# Patient Record
Sex: Male | Born: 1960 | Race: Asian | Hispanic: No | Marital: Married | Smoking: Former smoker
Health system: Western US, Academic
[De-identification: ages and names within clinical notes are randomized; demographics above are authoritative.]

## PROBLEM LIST (undated history)

## (undated) ENCOUNTER — Ambulatory Visit
Admission: EM | Discharge status: Against Medical Advice | Payer: Self-pay | Attending: Internal Medicine | Admitting: Internal Medicine

## (undated) DIAGNOSIS — I10 Essential (primary) hypertension: Secondary | ICD-10-CM

## (undated) DIAGNOSIS — L409 Psoriasis, unspecified: Secondary | ICD-10-CM

## (undated) DIAGNOSIS — K5792 Diverticulitis of intestine, part unspecified, without perforation or abscess without bleeding: Secondary | ICD-10-CM

## (undated) DIAGNOSIS — L309 Dermatitis, unspecified: Secondary | ICD-10-CM

## (undated) DIAGNOSIS — M549 Dorsalgia, unspecified: Secondary | ICD-10-CM

## (undated) HISTORY — DX: Dorsalgia, unspecified: M54.9

## (undated) HISTORY — PX: NO SURGICAL HISTORY: 101002

---

## 2012-08-19 ENCOUNTER — Ambulatory Visit (INDEPENDENT_AMBULATORY_CARE_PROVIDER_SITE_OTHER): Payer: PRIVATE HEALTH INSURANCE | Admitting: Internal Medicine

## 2012-08-19 VITALS — BP 123/86 | HR 80 | Temp 98.7°F | Resp 16 | Ht 63.0 in | Wt 159.6 lb

## 2012-08-19 DIAGNOSIS — IMO0002 Reserved for concepts with insufficient information to code with codable children: Secondary | ICD-10-CM

## 2012-08-19 DIAGNOSIS — F809 Developmental disorder of speech and language, unspecified: Secondary | ICD-10-CM

## 2012-08-19 DIAGNOSIS — J309 Allergic rhinitis, unspecified: Secondary | ICD-10-CM

## 2012-08-19 DIAGNOSIS — J3489 Other specified disorders of nose and nasal sinuses: Secondary | ICD-10-CM

## 2012-08-19 DIAGNOSIS — J302 Other seasonal allergic rhinitis: Secondary | ICD-10-CM

## 2012-08-19 DIAGNOSIS — J301 Allergic rhinitis due to pollen: Secondary | ICD-10-CM | POA: Insufficient documentation

## 2012-08-19 MED ORDER — FLUTICASONE PROPIONATE 50 MCG/ACT NA SUSP: 2.0000 | Freq: Every day | NASAL | Status: DC

## 2012-08-19 MED ORDER — METHYLPREDNISOLONE ACETATE 80 MG/ML IJ SUSP
120.0000 mg | Freq: Once | INTRAMUSCULAR | Status: AC
  Administered 2012-08-19: 120 mg via INTRAMUSCULAR

## 2012-08-19 MED ORDER — CETIRIZINE HCL 10 MG PO TABS: 10.0000 mg | ORAL_TABLET | Freq: Every day | ORAL | Status: DC

## 2012-08-19 NOTE — Progress Notes (Signed)
  Subjective:    Patient ID: Cory Murphy, male    DOB: 1960/11/28, 52 y.o.   MRN: [id number]  HPI Sneezing and nasal congestion for 1 monyh, has occ ha and fatigue. No cp, sob, chest congestion. Speaks broken English Has 2 daughters  Review of Systems neg    Objective:   Physical Exam  Vitals reviewed. Constitutional: He is oriented to person, place, and time. He appears well-developed and well-nourished. No distress.  HENT:  Right Ear: External ear normal.  Left Ear: External ear normal.  Nose: Mucosal edema, rhinorrhea and sinus tenderness present. Right sinus exhibits no maxillary sinus tenderness and no frontal sinus tenderness. Left sinus exhibits no maxillary sinus tenderness and no frontal sinus tenderness.  Mouth/Throat: Oropharynx is clear and moist.  Cardiovascular: Normal rate, regular rhythm and normal heart sounds.   Pulmonary/Chest: Effort normal and breath sounds normal. He has no wheezes.  Neurological: He is alert and oriented to person, place, and time. No cranial nerve deficit. He exhibits normal muscle tone. Coordination normal.  Psychiatric: He has a normal mood and affect. His behavior is normal.          Assessment & Plan:  Depomedrol/Cetirizine/fluticasone nasal.

## 2012-08-19 NOTE — Patient Instructions (Addendum)
Allergies, Generic  Allergies may happen from anything your body is sensitive to. This may be food, medicines, pollens, chemicals, and nearly anything around you in everyday life that produces allergens. An allergen is anything that causes an allergy producing substance. Heredity is often a factor in causing these problems. This means you may have some of the same allergies as your parents.  Food allergies happen in all age groups. Food allergies are some of the most severe and life threatening. Some common food allergies are cow's milk, seafood, eggs, nuts, wheat, and soybeans.  SYMPTOMS    Swelling around the mouth.   An itchy red rash or hives.   Vomiting or diarrhea.   Difficulty breathing.  SEVERE ALLERGIC REACTIONS ARE LIFE-THREATENING.  This reaction is called anaphylaxis. It can cause the mouth and throat to swell and cause difficulty with breathing and swallowing. In severe reactions only a trace amount of food (for example, peanut oil in a salad) may cause death within seconds.  Seasonal allergies occur in all age groups. These are seasonal because they usually occur during the same season every year. They may be a reaction to molds, grass pollens, or tree pollens. Other causes of problems are house dust mite allergens, pet dander, and mold spores. The symptoms often consist of nasal congestion, a runny itchy nose associated with sneezing, and tearing itchy eyes. There is often an associated itching of the mouth and ears. The problems happen when you come in contact with pollens and other allergens. Allergens are the particles in the air that the body reacts to with an allergic reaction. This causes you to release allergic antibodies. Through a chain of events, these eventually cause you to release histamine into the blood stream. Although it is meant to be protective to the body, it is this release that causes your discomfort. This is why you were given anti-histamines to feel better. If you are  unable to pinpoint the offending allergen, it may be determined by skin or blood testing. Allergies cannot be cured but can be controlled with medicine.  Hay fever is a collection of all or some of the seasonal allergy problems. It may often be treated with simple over-the-counter medicine such as diphenhydramine. Take medicine as directed. Do not drink alcohol or drive while taking this medicine. Check with your caregiver or package insert for child dosages.  If these medicines are not effective, there are many new medicines your caregiver can prescribe. Stronger medicine such as nasal spray, eye drops, and corticosteroids may be used if the first things you try do not work well. Other treatments such as immunotherapy or desensitizing injections can be used if all else fails. Follow up with your caregiver if problems continue. These seasonal allergies are usually not life threatening. They are generally more of a nuisance that can often be handled using medicine.  HOME CARE INSTRUCTIONS    If unsure what causes a reaction, keep a diary of foods eaten and symptoms that follow. Avoid foods that cause reactions.   If hives or rash are present:   Take medicine as directed.   You may use an over-the-counter antihistamine (diphenhydramine) for hives and itching as needed.   Apply cold compresses (cloths) to the skin or take baths in cool water. Avoid hot baths or showers. Heat will make a rash and itching worse.   If you are severely allergic:   Following a treatment for a severe reaction, hospitalization is often required for closer follow-up.     use an anaphylaxis kit.  If you have had a severe reaction, always carry your anaphylaxis kit or EpiPen with you. Use this medicine as directed by your caregiver if a severe reaction is occurring. Failure to do so could have a fatal outcome. SEEK  MEDICAL CARE IF:  You suspect a food allergy. Symptoms generally happen within 30 minutes of eating a food.  Your symptoms have not gone away within 2 days or are getting worse.  You develop new symptoms.  You want to retest yourself or your child with a food or drink you think causes an allergic reaction. Never do this if an anaphylactic reaction to that food or drink has happened before. Only do this under the care of a caregiver. SEEK IMMEDIATE MEDICAL CARE IF:   You have difficulty breathing, are wheezing, or have a tight feeling in your chest or throat.  You have a swollen mouth, or you have hives, swelling, or itching all over your body.  You have had a severe reaction that has responded to your anaphylaxis kit or an EpiPen. These reactions may return when the medicine has worn off. These reactions should be considered life threatening. MAKE SURE YOU:   Understand these instructions.  Will watch your condition.  Will get help right away if you are not doing well or get worse. Document Released: 06/29/2002 Document Revised: 06/28/2011 Document Reviewed: 12/04/2007 [facility] Patient Information 2013 [place], [place]. C?m M?o (Hay Fever) B?nh l ph?n ?ng d? ?ng v?i cc h?t trong khng kh. C?m m?o khng ly t? ng??i sang ng??i. Khng th? ch?a kh?i h?n b?nh ny nh?ng c th? ???c ki?m sot. NGUYN NHN  C?m m?o gy b?i ci g ? gy ph?n ?ng d? ?ng (ch?t gy d? ?ng). Sau ?y l nh?ng v d? v? ch?t gy d? ?ng:   C? ph?n h??ng.  Lng.  B?i b?n t? ??ng v?t.  C? v ph?n hoa.  Khi thu?c l.  B?i trong nh.   nhi?m. TRI?U CH?NG  H?t h?i.  Ch?y n??c m?i ho?c ng?t m?i.  Ch?y n??c m?t.  M?t, m?i, mi?ng, h?ng, da ho?c khu v?c khc b? ng?a.  ?au h?ng.  ?au ??u.  Gi?m thnh gic ho?c v? gic. CH?N ?ON  Chuyn gia ch?m [place] y t? s? khm tr?c ti?p v h?i v? cc tri?u ch?ng b?n ?ang c. Ki?m tra d? ?ng c th? ???c th?c hi?n ?? xc ??nh chnh xc nguyn nhn gy ra b?nh  c?m m?o c?a b?n.  ?I?U TR?  Thu?c mua tr?c ti?p t?i hi?u thu?c c th? gip gi?m cc tri?u ch?ng. Cc calo ny bao g?m:  Thu?c khng histamine.  Thu?c thng m?i. Thu?c ny c th? gip gi?m ngh?t m?i.  N?u nh?ng thu?c ny khng c tc d?ng, c nh?ng lo?i thu?c khc m chuyn gia ch?m [place] y t? c th? k cho b?n.  M?t s? ng??i h??ng l?i t? vi?c chch ng?a d? ?ng khi cc lo?i thu?c khc khng c tc d?ng. H??NG D?N CH?M [place] T?I NH   Trnh ch?t d? ?ng gy ra cc tri?u ch?ng c?a b?n, n?u c th?.  S? d?ng t?t c? thu?c theo ch? d?n c?a chuyn gia ch?m [place] y t?. HY THAM V?N V?I CHUYN GIA Y T? N?U:   B?n c cc tri?u ch?ng d? ?ng nghim tr?ng v cc lo?i thu?c hi?n t?i c?a b?n khng c?i thi?n ???c v?n ??.  S? ?i?u tr? c?a b?n c lc c hi?u qu?, nh?ng by gi?  b?n b? cc tri?u ch?ng.  B?n b? t?c ngh?n xoang v p l?c.  B?n b? s?t ho?c ?au ??u.  B?n b? ch?y n??c m?i ??c.  B?n b? hen suy?n v b? ho v th? kh kh n?ng thm. HY NGAY L?P T?C THAM V?N V?I CHUYN GIA Y T? N?U:  B?n b? s?ng l??i ho?c mi.  B?n b? kh th?.  B?n c?m th?y chng m?t ho?c c?m th?y nh? s?p ng?t.  B?n ?? m? hi l?nh.  B?n b? s?t. Document Released: 04/05/2005 Document Revised: 06/28/2011 [facility] Patient Information 2013 [place], [place].

## 2012-08-19 NOTE — Progress Notes (Signed)
  Subjective:    Patient ID: Cory Murphy, male    DOB: [birth year], 52 y.o.   MRN: [id number]  HPI Patient presents today with complaints of a headache in his sinus area above his eyes and nasal congestion for the past five days. He states mucous is dark green and has an odor to it. Denies cough, sore throat, fever,ear pain or dizziness, chest pain. He states that it is worse when he goes outside and sneezes many times. He has only tried Tylenol Sinus it temporarily resolves the pressure but it comes back regularly.    Review of Systems     Objective:   Physical Exam        Assessment & Plan:

## 2012-12-20 ENCOUNTER — Ambulatory Visit: Payer: PRIVATE HEALTH INSURANCE

## 2012-12-20 ENCOUNTER — Ambulatory Visit (INDEPENDENT_AMBULATORY_CARE_PROVIDER_SITE_OTHER): Payer: PRIVATE HEALTH INSURANCE | Admitting: Emergency Medicine

## 2012-12-20 VITALS — BP 130/84 | HR 79 | Temp 98.1°F | Resp 16 | Ht 63.0 in | Wt 154.0 lb

## 2012-12-20 DIAGNOSIS — M25572 Pain in left ankle and joints of left foot: Secondary | ICD-10-CM

## 2012-12-20 DIAGNOSIS — M25579 Pain in unspecified ankle and joints of unspecified foot: Secondary | ICD-10-CM

## 2012-12-20 DIAGNOSIS — M109 Gout, unspecified: Secondary | ICD-10-CM

## 2012-12-20 DIAGNOSIS — Z603 Acculturation difficulty: Secondary | ICD-10-CM

## 2012-12-20 DIAGNOSIS — Z609 Problem related to social environment, unspecified: Secondary | ICD-10-CM

## 2012-12-20 LAB — POCT CBC
Granulocyte percent: 70.6 %G (ref 37–80)
HCT, POC: 41.1 % — AB (ref 43.5–53.7)
Hemoglobin: 13.2 g/dL — AB (ref 14.1–18.1)
Lymph, poc: 2.2 (ref 0.6–3.4)
MCH, POC: 29.9 pg (ref 27–31.2)
MCHC: 32.1 g/dL (ref 31.8–35.4)
MCV: 93.3 fL (ref 80–97)
MID (cbc): 0.9 (ref 0–0.9)
MPV: 8.1 fL (ref 0–99.8)
POC Granulocyte: 7.5 — AB (ref 2–6.9)
POC LYMPH PERCENT: 20.7 %L (ref 10–50)
POC MID %: 8.7 %M (ref 0–12)
Platelet Count, POC: 241 10*3/uL (ref 142–424)
RBC: 4.41 M/uL — AB (ref 4.69–6.13)
RDW, POC: 15.5 %
WBC: 10.6 10*3/uL — AB (ref 4.6–10.2)

## 2012-12-20 LAB — URIC ACID: Uric Acid, Serum: 6.4 mg/dL (ref 4.0–7.8)

## 2012-12-20 MED ORDER — INDOMETHACIN 25 MG PO CAPS: 25.0000 mg | ORAL_CAPSULE | Freq: Three times a day (TID) | ORAL | Status: DC

## 2012-12-20 MED ORDER — COLCHICINE 0.6 MG PO TABS: ORAL_TABLET | ORAL | Status: DC

## 2012-12-20 NOTE — Patient Instructions (Addendum)
B?nh Gt (Gout)  Gt l m?t tnh tr?ng vim nhi?m (vim kh?p) gy ra b?i s? tch t? c?a tinh th? axit uric trong kh?p. Axit uric l m?t ch?t ha h?c th??ng hi?n di?n trong mu. Trong m?t s? tr??ng h?p, axit uric c th? hnh thnh cc tinh th? trong kh?p x??ng c?a b?n. ?i?u ny gy ra t?y ??, ?au nh?c v s?ng kh?p (vim). Cc ??t ?au l?p l?i l ph? bi?n. Theo th?i gian, cc tinh th? axit uric c th? hnh thnh v?i quy m l?n (tophi) g?n kh?p, gy bi?n d?ng. Gt c th? ?i?u tr? v th??ng ng?n ng?a ???c. NGUYN NHN C?n b?nh ny b?t ??u v?i n?ng ?? axit uric trong mu t?ng. Axit uric ???c t?o ra b?i c? th? khi n ph v? m?t ch?t t? nhin tm th?y ???c g?i l purin. ?i?u ny c?ng x?y ra khi b?n ?n m?t s? lo?i th?c ph?m nh? th?t v c. Nguyn nhn c?a n?ng ?? axit uric cao bao g?m:  Truy?n t? cha m? sang con (di truy?n).  B?nh gy ra gia t?ng s?n xu?t axit uric (bo ph, b?nh v?y n?n, m?t s? b?nh ung th?).  S? d?ng r??u qu m?c.  Ch? ?? ?n, ??c bi?t l ch? ?? ?n nhi?u th?t v h?i s?n.  Thu?c, bao g?m c? m?t s? lo?i thu?c ch?ng ung th? (ha tr?), thu?c l?i ti?u v atpirin.  B?nh th?n mn tnh. Th?n khng cn c th? lo?i b? t?t axit uric.  V?n ?? v? chuy?n ha. Cc tnh tr?ng g?n li?n v?i b?nh gt bao g?m:  Bo ph.  p huy?t cao.  Cholesterol cao.  Ti?u ???ng. Khng ph?i t?t c? m?i ng??i v?i n?ng ?? axit uric cao ??u b? b?nh gt. Khng th? gi?i thch v sao m?t s? ng??i b? gt cn nh?ng ng??i khc l?i khng b?. Ph?u thu?t, ch?n th??ng kh?p v ?n qu nhi?u m?t s? lo?i th?c ?n nh?t ??nh l m?t s? trong nh?ng y?u t? c th? d?n ??n b?nh gt.  TRI?U CH?NG  C?n b?nh gt xu?t hi?n nhanh chng. B?nh gy ?au ??n d? d?i v?i t?y ??, s?ng v ?m trong kh?p.  C th? b? s?t.  Thng th??ng, ch? l m?t kh?p b? ?au. M?t s? kh?p nh?t ??nh th??ng b? ?au:  ?? ngn chn ci.  ??u g?i.  M?t c chn.  C? tay.  Ngn tay. N?u khng ?i?u tr?, c?n ?au th??ng bi?n m?t trong m?t vi ngy ??n vi tu?n. Gi?a  cc c?n ?au, b?n th??ng s? khng c cc tri?u ch?ng, cc tri?u ch?ng th??ng khc v?i cc hnh th?c vim kh?p khc. CH?N ?ON Chuyn gia ch?m [place] y t? s? nghi ng? b?nh gt d?a trn cc tri?u ch?ng v vi?c khm. Vi?c ht d?ch t? kh?p (ch?c kh?p) ???c th?c hi?n ?? ki?m tra cc tinh th? axit uric. Chuyn gia ch?m [place] y t? s? cung c?p cho b?n m?t lo?i thu?c lm t khu v?c (gy t c?c b?) v s? d?ng m?t kim tim ?? ht d?ch kh?p ?? xt nghi?m. Gt ???c xc nh?n khi cc tinh th? axit uric ???c tm th?y trong d?ch kh?p, b?ng cch s? d?ng m?t knh hi?n vi ??c bi?t. ?i khi, cc xt nghi?m mu, n??c ti?u, X quang c?ng ???c s? d?ng. ?I?U TR? C 2 giai ?o?n ?? ?i?u tr? b?nh gt: ?i?u tr? c?n ?au kh?i pht ??t ng?t (c?p tnh) v ng?n ch?n cc c?n ?au (d? phng). ?i?u Tr?  C?n ?au C?p Tnh  Thu?c ???c s? d?ng. Chng bao g?m cc lo?i thu?c ch?ng vim ho?c thu?c steroid.  ?i khi c?n tim thu?c steroid vo kh?p b? ?au.  Kh?p ?au ???c ngh? ng?i. S? v?n ??ng c th? lm tr?m tr?ng thm b?nh vim kh?p.  B?n c th? s? d?ng ph??ng php ?i?u tr? ?m ho?c l?nh trn kh?p b? ?au, ty thu?c ph??ng php no ph h?p nh?t v?i b?n.  Th?o lu?n v? vi?c s? d?ng c ph, vitamin C, ho?c anh ?o v?i chuyn gia ch?m [place] y t?. ?y c th? l cc l?a ch?n ?i?u tr? h?u ch. ?i?u Tr? ?? Ng?n Ch?n Cc C?n ?au Sau khi c?n ?au c?p tnh gi?m, chuyn gia ch?m [place] y t? c th? t? v?n s? d?ng thu?c d? phng. Nh?ng lo?i thu?c ny c gip th?n lo?i b? axit uric ra kh?i c? th? c?a b?n ho?c gi?m s?n xu?t axit uric. B?n c th? c?n ti?p t?c s? d?ng cc lo?i thu?c ny trong m?t th?i gian r?t di. Giai ?o?n ?i?u tr? ban ??u b?ng thu?c d? phng c th? k?t h?p v?i s? gia t?ng cc c?n ?au gt c?p. V l do ny, trong nh?ng thng ??u ?i?u tr?, chuyn gia ch?m [place] y t? c?ng c th? t? v?n cho b?n dng thu?c th??ng ???c s? d?ng ?? ?i?u tr? b?nh gt c?p tnh. ??m b?o b?n hi?u h??ng d?n c?a chuyn gia ch?m [place] y t?. B?n c?ng nn th?o lu?n v? ph??ng php ?i?u tr?  b?ng ch? ?? ?n u?ng v?i chuyn gia ch?m [place] y t?. M?t s? th?c ph?m nh?t ??nh nh? th?t v c c th? lm t?ng n?ng ?? axit uric. Cc lo?i th?c ph?m khc nh? s?a c th? lm gi?m n?ng ?? ny. Chuyn gia ch?m [place] y t? c th? cung c?p cho b?n m?t danh sch nh?ng th?c ph?m c?n trnh. H??NG D?N CH?M [place] T?I NH  KHNG u?ng aspirin ?? gi?m ?au; thu?c ny lm t?ng m?c ?? a-xt uric.  Ch? dng cc thu?c ???c bn khng c?n ??n thu?c c?a Bc s? ho?c theo toa c?a Bc s? ?? gi?m ?au, kh ch?u, hay s?t theo nh? h??ng d?n c?a Bc s?.  ?? kh?p ngh? ng?i cng nhi?u cng t?t. Khi ? trn gi??ng, gi? cho kh?n tr?i gi??ng v ch?n trnh xa nh?ng ch? b? ?au.  Gi? cho kh?p b? ?au ? trn cao (gi? cao).  Dng n?ng n?u ch? kh?p b? ?au l ? chn.  U?ng ?? n??c v dung d?ch ?? n??c ti?u trong ho?c c mu vng nh?t . Lm nh? v?y gip c? th? c?a b?n lo?i b? ???c a-xt uric. KHNG u?ng cc ?? u?ng c ch?t c?n v nh?ng ?? u?ng ny lm ch?m qu trnh bi ti?t a-xt uric.  Th?c hi?n theo h??ng d?n v? ch? ?? ?n u?ng c?a chuyn gia ch?m [place] y t?. Hy ch  c?n th?n ??n l??ng prtein b?n ?n. Ch? ?? ?n u?ng hng ngy c?a b?n nn t?p [place] vo tri cy, rau, ng? c?c nguyn h?t v nh?ng s?n ph?m s?a khng c ch?t bo ho?c t ch?t bo.  Duy tr tr?ng l??ng c? th? kh?e m?nh. H?Y THAM V?N V?I CHUYN GIA Y T? N?U:  Nhi?t ?? ?o ? mi?ng trn 38,9 C (102 F).  B?n b? tiu ch?y ho?c nn m?a.  B?n khng th?y ?? trong vng 24 ti?ng ??ng h? ho?c ngy cng n?ng h?n. H?Y NGAY L?P T?C THAM V?N V?I  CHUYN GIA Y T? N?U:  Kh?p c?a b?n b?ng d?ng tr? nn m?m h?n nhi?u v ?i km v?i:  Nh?ng c?n rng mnh v l?nh.  Nhi?t ?? ?o ? mi?ng trn 38,9 C (102 F), khng gi?m sau khi dng thu?c. ??M B?O B?N:  Hi?u r nh?ng h??ng d?n khi xu?t vi?n.  S? theo di tnh tr?ng b?nh c?a b?n.  S? ??n khm b?nh ngay l?p t?c nh? ? ???c h??ng d?n. Document Released: 01/13/2005 Document Revised: 06/28/2011 [facility] Patient Information 2014 [place],  [place].

## 2012-12-20 NOTE — Progress Notes (Signed)
[facility] [address], [place] [address] [phone]  Date:  12/20/2012   Name:  Cory Murphy   DOB:  03-22-1961   MRN:  [id number]  PCP:  No primary provider on file.    Chief Complaint: Gout   History of Present Illness:  Cory Murphy is a 52 y.o. very pleasant male patient who presents with the following:  Was treated in the past with medication from a clinic in [facility] for gout in the remote past but the clinic closed and he has no further issue with gout.  Developed pain in foot and ankle on Saturday night and it has swelled and the pain is increased.  No history of injury or overuse.  No improvement with over the counter medications or other home remedies. Denies other complaint or health concern today.   Patient Active Problem List   Diagnosis Date Noted  . Language barrier, cultural differences 12/20/2012  . Hay fever 08/19/2012    History reviewed. No pertinent past medical history.  History reviewed. No pertinent past surgical history.  History  Substance Use Topics  . Smoking status: Former Games developer  . Smokeless tobacco: Not on file  . Alcohol Use: Yes    History reviewed. No pertinent family history.  No Known Allergies  Medication list has been reviewed and updated.  Current Outpatient Prescriptions on File Prior to Visit  Medication Sig Dispense Refill  . acetaminophen (TYLENOL) 500 MG tablet Take 500 mg by mouth every 6 (six) hours as needed for pain.      . fluticasone (FLONASE) 50 MCG/ACT nasal spray Place 2 sprays into the nose daily.  16 g  6  . cetirizine (ZYRTEC) 10 MG tablet Take 1 tablet (10 mg total) by mouth daily.  30 tablet  11   No current facility-administered medications on file prior to visit.    Review of Systems:  As per HPI, otherwise negative.    Physical Examination: Filed Vitals:   12/20/12 0952  BP: 130/84  Pulse: 79  Temp: 98.1 F (36.7 C)  Resp: 16   Filed Vitals:   12/20/12 0952   Height: 5\' 3"  (1.6 m)  Weight: 154 lb (69.854 kg)   Body mass index is 27.29 kg/(m^2). Ideal Body Weight: Weight in (lb) to have BMI = 25: 140.8   GEN: WDWN, NAD, Non-toxic, Alert & Oriented x 3 HEENT: Atraumatic, Normocephalic.  Ears and Nose: No external deformity. EXTR: No clubbing/cyanosis/edema NEURO: marked antalgic gait.  PSYCH: Normally interactive. Conversant. Not depressed or anxious appearing.  Calm demeanor.  LEFT ankle:  Swollen with effusion ankle. Tender, guards.  No fever mild erythema   Assessment and Plan: Gout Labs Crutches   Signed,  Phillips Odor, MD   Results for orders placed in visit on 12/20/12  POCT CBC      Result Value Range   WBC 10.6 (*) 4.6 - 10.2 K/uL   Lymph, poc 2.2  0.6 - 3.4   POC LYMPH PERCENT 20.7  10 - 50 %L   MID (cbc) 0.9  0 - 0.9   POC MID % 8.7  0 - 12 %M   POC Granulocyte 7.5 (*) 2 - 6.9   Granulocyte percent 70.6  37 - 80 %G   RBC 4.41 (*) 4.69 - 6.13 M/uL   Hemoglobin 13.2 (*) 14.1 - 18.1 g/dL   HCT, POC 29.5 (*) 62.1 - 53.7 %   MCV 93.3  80 - 97 fL  MCH, POC 29.9  27 - 31.2 pg   MCHC 32.1  31.8 - 35.4 g/dL   RDW, POC 16.1     Platelet Count, POC 241  142 - 424 K/uL   MPV 8.1  0 - 99.8 fL    [facility] reading (PRIMARY) by  Dr. Dareen Piano.  Ankle negative.

## 2014-04-17 ENCOUNTER — Inpatient Hospital Stay
Admission: EM | Admit: 2014-04-17 | Discharge: 2014-04-21 | DRG: 378 | Disposition: A | Discharge status: Home / Self Care | Payer: MEDICAID | Attending: SURGERY | Admitting: SURGERY

## 2014-04-17 ENCOUNTER — Emergency Department (EMERGENCY_DEPARTMENT_HOSPITAL): Payer: MEDICAID

## 2014-04-17 ENCOUNTER — Emergency Department: Payer: MEDICAID

## 2014-04-17 DIAGNOSIS — K5720 Diverticulitis of large intestine with perforation and abscess without bleeding: Secondary | ICD-10-CM | POA: Insufficient documentation

## 2014-04-17 DIAGNOSIS — L409 Psoriasis, unspecified: Secondary | ICD-10-CM | POA: Diagnosis present

## 2014-04-17 DIAGNOSIS — K529 Noninfective gastroenteritis and colitis, unspecified: Secondary | ICD-10-CM

## 2014-04-17 DIAGNOSIS — I10 Essential (primary) hypertension: Secondary | ICD-10-CM | POA: Diagnosis present

## 2014-04-17 DIAGNOSIS — K5780 Diverticulitis of intestine, part unspecified, with perforation and abscess without bleeding: Secondary | ICD-10-CM

## 2014-04-17 DIAGNOSIS — K651 Peritoneal abscess: Secondary | ICD-10-CM

## 2014-04-17 DIAGNOSIS — K5721 Diverticulitis of large intestine with perforation and abscess with bleeding: Principal | ICD-10-CM | POA: Diagnosis present

## 2014-04-17 HISTORY — DX: Essential (primary) hypertension: I10

## 2014-04-17 HISTORY — DX: Diverticulitis of intestine, part unspecified, without perforation or abscess without bleeding: K57.92

## 2014-04-17 HISTORY — DX: Psoriasis, unspecified: L40.9

## 2014-04-17 LAB — CBC WITH DIFFERENTIAL
BASOPHILS % AUTO: 0.4 %
BASOPHILS % AUTO: 1.2 %
BASOPHILS ABS AUTO: 0 10*3/uL (ref 0–0.2)
BASOPHILS ABS AUTO: 0.1 10*3/uL (ref 0–0.2)
EOSINOPHIL % AUTO: 2.7 %
EOSINOPHIL % AUTO: 2.4 %
EOSINOPHIL ABS AUTO: 0.2 10*3/uL (ref 0–0.5)
EOSINOPHIL ABS AUTO: 0.3 10*3/uL (ref 0–0.5)
HEMATOCRIT: 38.5 % — AB (ref 41–53)
HEMATOCRIT: 39.3 % — AB (ref 41–53)
HEMOGLOBIN: 12.4 g/dL — AB (ref 13.5–17.5)
HEMOGLOBIN: 12.9 g/dL — AB (ref 13.5–17.5)
LYMPHOCYTE ABS AUTO: 1.3 10*3/uL (ref 1.0–4.8)
LYMPHOCYTE ABS AUTO: 3.7 10*3/uL (ref 1.0–4.8)
LYMPHOCYTES % AUTO: 19.5 %
LYMPHOCYTES % AUTO: 32.8 %
MCH: 28.2 pg (ref 27–33)
MCH: 28.7 pg (ref 27–33)
MCHC: 32.3 % (ref 32–36)
MCHC: 32.9 % (ref 32–36)
MCV: 87.3 UM3 (ref 80–100)
MCV: 87.2 UM3 (ref 80–100)
MONOCYTES % AUTO: 5.6 %
MONOCYTES % AUTO: 5 %
MONOCYTES ABS AUTO: 0.4 10*3/uL (ref 0.1–0.8)
MONOCYTES ABS AUTO: 0.6 10*3/uL (ref 0.1–0.8)
MPV: 7.6 UM3 (ref 6.8–10.0)
MPV: 8.4 UM3 (ref 6.8–10.0)
NEUTROPHIL ABS AUTO: 4.7 10*3/uL (ref 1.80–7.70)
NEUTROPHIL ABS AUTO: 6.6 10*3/uL (ref 1.80–7.70)
NEUTROPHILS % AUTO: 71.8 %
NEUTROPHILS % AUTO: 58.6 %
PLATELET COUNT: 299 10*3/uL (ref 130–400)
PLATELET COUNT: 338 10*3/uL (ref 130–400)
PLATELET ESTIMATE, SMEAR: ADEQUATE
RDW: 12.2 U (ref 0–14.7)
RDW: 12 U (ref 0–14.7)
RED CELL COUNT: 4.41 10*6/uL — AB (ref 4.5–5.9)
RED CELL COUNT: 4.51 10*6/uL (ref 4.5–5.9)
WHITE BLOOD CELL COUNT: 6.5 10*3/uL (ref 4.5–11.0)
WHITE BLOOD CELL COUNT: 11.2 10*3/uL — AB (ref 4.5–11.0)

## 2014-04-17 LAB — BASIC METABOLIC PANEL
CALCIUM: 9.2 mg/dL (ref 8.6–10.5)
CARBON DIOXIDE TOTAL: 28 meq/L (ref 24–32)
CHLORIDE: 102 meq/L (ref 95–110)
CREATININE BLOOD: 1.06 mg/dL (ref 0.44–1.27)
GLUCOSE: 157 mg/dL — AB (ref 70–99)
POTASSIUM: 3.8 meq/L (ref 3.3–5.0)
SODIUM: 139 meq/L (ref 135–145)
UREA NITROGEN, BLOOD (BUN): 11 mg/dL (ref 8–22)

## 2014-04-17 LAB — URINALYSIS-COMPLETE
BILIRUBIN URINE: NEGATIVE
GLUCOSE URINE: NEGATIVE mg/dL
HYALINE CASTS: 20 /LPF — AB (ref 0–5)
KETONES: NEGATIVE mg/dL
LEUK. ESTERASE: NEGATIVE
NITRITE URINE: NEGATIVE
OCCULT BLOOD URINE: NEGATIVE mg/dL
PH URINE: 6 (ref 4.8–7.8)
PROTEIN URINE: 30 mg/dL — AB
RBC: 1 /HPF (ref 0–5)
SPECIFIC GRAVITY: 1.025 (ref 1.002–1.030)
SQUAMOUS EPI: 1 /HPF (ref 0–5)
UROBILINOGEN.: 2 mg/dL (ref ?–2.0)
WBC: 5 /HPF (ref 0–5)

## 2014-04-17 LAB — TYPE AND SCREEN
ANTIBODY SCREEN (ORTHO GEL): NEGATIVE
PATIENT BLOOD TYPE: A POS

## 2014-04-17 MED ORDER — CEFTRIAXONE 1 GRAM SOLUTION FOR INJECTION
1.0000 g | Freq: Once | INTRAMUSCULAR | Status: AC
Start: 2014-04-17 — End: 2014-04-17
  Administered 2014-04-17: 1 g via INTRAVENOUS
  Filled 2014-04-17: qty 1

## 2014-04-17 MED ORDER — IOHEXOL 350 MG IODINE/ML INTRAVENOUS SOLUTION
125.0000 mL | INTRAVENOUS | Status: AC
Start: 2014-04-17 — End: 2014-04-17
  Administered 2014-04-17: 125 mL via INTRAVENOUS

## 2014-04-17 MED ORDER — NACL 0.9% IV BOLUS - DURATION REQ
1000.0000 mL | Freq: Once | INTRAVENOUS | Status: AC
Start: 2014-04-17 — End: 2014-04-17
  Administered 2014-04-17: 1000 mL via INTRAVENOUS

## 2014-04-17 MED ORDER — METRONIDAZOLE 500 MG/100 ML IN SODIUM CHLOR(ISO) INTRAVENOUS PIGGYBACK
500.0000 mg | INJECTION | Freq: Once | INTRAVENOUS | Status: AC
Start: 2014-04-17 — End: 2014-04-17
  Administered 2014-04-17: 500 mg via INTRAVENOUS
  Filled 2014-04-17: qty 100

## 2014-04-17 NOTE — ED Nursing Note (Addendum)
Pt c/o blood in stool x 1 month, seen at another hospital, prescibed flagyl, believes this medicine is too strong, pt A&O, states has had 8/10 low abd pain x 1 month as well, denies N&V, states has fevers at night

## 2014-04-17 NOTE — ED Nursing Note (Signed)
Vs/reassess; ambulatory to triage, denies changes while waiting.

## 2014-04-17 NOTE — Utilization Review (ED) (Signed)
Utilization Review Admission screen for Patient Class    04/17/2014  20:41    Name: Lance Robertson MRN: [id number]  Anticipate patient will be admitted to the hospital from the Emergency Department.   Based on review of current documentation and/or discussion with admitting physician; recommend patient class for Inpatient.      Adela LankKristina Prestyn Stanco,BS,RN  Clinical Case Management  Ph. [phone]  pgr [phone]

## 2014-04-17 NOTE — ED Nursing Note (Addendum)
Pt ambulatory to triage, states has had 1 more stool with some red blood. States more frequently bloody stools, c/o lower abd pain 8/10. Vss.

## 2014-04-17 NOTE — ED Progress Note (Signed)
Emergency Department Triage Note     Subjective: Lance Robertson is a [age]yr old male who presents to the ED with a chief complaint of lower abd pain, diarrhea, blood in stool[facility] (has paper work with him).   Request Systems developerVietnamese translator.  Requesting colonoscopy. Has PCP     Patient's phone number confirmed - y.     Objective (pertinent exam findings):    BP 110/72 mmHg  Pulse 85  Temp(Src) 36.9 C (98.4 F) (Oral)  Resp 20  Wt 52.1 kg (114 lb 13.8 oz)  SpO2 98%     nad  Mild/moderate suprapubic and LLQ ttp    Assessment: Lance Reilrung Harpole is a [age]yr old male with abd pain, diarrhea.      Plan: Initial studies to evaluate this problem will include labs. Further workup will then proceed in patient care area c/d. ?  Seen and initially evaluated by:  Hal HopeMary Lai Lear Carstens, MD, Attending Physician, Department of Emergency Medicine       This patient was seen by a physician in triage. Please see the ED Initial Note for full details of this patient's care. This note covers the brief initial evaluation performed to obtain initial studies to expedite the patient's care. It is not intended to be a comprehensive document of the patient's ED visit.    This patient was instructed that this preliminary assessment and any studies obtained do not constitute a full workup of the patient's chief complaint. The patient was instructed to wait in the assigned area after the triage evaluation to be reevaluated by a physician after initial studies are completed.

## 2014-04-17 NOTE — ED Nursing Note (Signed)
Report from Alex RN- care assumed

## 2014-04-17 NOTE — ED Triage Note (Signed)
Pt a/ox 4, ambulatory to triage with c/o diarrhea, blood in stool and fevers.  Pt with recent dx of diverticulitis.  Abdmomen soft, nttp to suprapubic area, LLQ.  Skin warm, dry.

## 2014-04-17 NOTE — ED Initial Note (Signed)
EMERGENCY DEPARTMENT PHYSICIAN NOTE - Lance Robertson       Date of Service:   04/17/2014  5:30 PM Patient's PCP: No Pcp No Pcp   Note Started: 04/17/2014 17:31 DOB: 1961-01-11             Chief Complaint   Patient presents with    Blood In Stool           The history provided by the patient.  Interpreter used: No    Lance Robertson is a 53yr old male, with past medical history significant for HTN, diverticulitis, and potential hx of reflux, who presents to the ED with a chief complaint of diarrhea that is intermittently bloody that began 1 month ago. He notes that he sometimes has 5 episodes of diarrhea hourly; some of these stools are black or have clots of blood. He has never had anything similar to this problem before, and it has not gotten better or worse over the past month. He was admitted to the hospital and told he had diverticulitis. He was told to follow up with PCP for colonoscopy. He also notes rectal pain, abdominal pain, nausea when he eats, intermittent fever.     A full history, including pertinent past medical, family and social history was reviewed.    HISTORY:  There are no hospital problems to display for this patient.   Allergies   Allergen Reactions    Excedrin [Acetaminophen-Caffeine] Hives      No past medical history on file. No past surgical history on file.   Social History    Marital Status: MARRIED             Spouse Name:                       Years of Education:                 Number of children:               Occupational History    None on file    Social History Main Topics    Smoking Status: Not on file                       Smokeless Status: Not on file                       Alcohol Use: Not on file     Drug Use: Not on file     Sexual Activity: Not on file          Other Topics            Concern    None on file    Social History Narrative    None on file     No family history on file.       Review of Systems   Constitutional: Positive for fever and unexpected weight change  (weight loss). Negative for chills and appetite change.   Respiratory: Negative for shortness of breath.    Cardiovascular: Negative for chest pain.   Gastrointestinal: Positive for nausea and rectal pain. Negative for vomiting and abdominal pain.   Genitourinary: Negative for hematuria.   Musculoskeletal: Negative for back pain.   Skin: Negative for pallor.   Allergic/Immunologic: Positive for immunocompromised state.   Neurological: Negative for syncope.   Hematological: Does not bruise/bleed easily.   Psychiatric/Behavioral: Negative for confusion.   All other systems  reviewed and are negative.       TRIAGE VITAL SIGNS:  Temp: 36.9 C (98.4 F) (04/17/14 1042)  Temp src: Oral (04/17/14 1042)  Pulse: 85 (04/17/14 1042)  BP: 110/72 mmHg (04/17/14 1042)  Resp: 20 (04/17/14 1042)  SpO2: 98 % (04/17/14 1042)  Weight: 52.1 kg (114 lb 13.8 oz) (04/17/14 1042)    Physical Exam   Constitutional: He is oriented to person, place, and time. He appears well-developed and well-nourished. No distress.   HENT:   Head: Normocephalic and atraumatic.   Mouth/Throat: Oropharynx is clear and moist.   Eyes: EOM are normal. Pupils are equal, round, and reactive to light.   Neck: Normal range of motion. Neck supple.   Cardiovascular: Normal rate, regular rhythm, normal heart sounds and intact distal pulses.    No murmur heard.  Pulmonary/Chest: Effort normal and breath sounds normal. No respiratory distress. He has no wheezes. He has no rales.   Abdominal: Soft. Bowel sounds are normal. He exhibits no distension. There is no tenderness. There is no rebound and no guarding.   Genitourinary: Guaiac positive stool.   External hemorrhoids    Musculoskeletal: Normal range of motion.   Neurological: He is alert and oriented to person, place, and time.   Skin: Skin is warm and dry. He is not diaphoretic.   Psoriasis    Psychiatric: He has a normal mood and affect. His behavior is normal.   Nursing note and vitals reviewed.      INITIAL  ASSESSMENT & PLAN, MEDICAL DECISION MAKING, ED COURSE:  Lance Robertson is a 53yr old male who presents with a chief complaint of bloody diarrhea. After history and exam, the differential diagnosis includes, but is not limited to peptic ulcer disease, diverticulosis, diverticulitis, C-diff, IBD, colitis, hemorrhagic gastritis.  The patient is hemodynamically stable. Initial treatment and studies to evaluate this problem will include serial exams, labs and imaging.      ED Course Update: The patient was observed in the ED. The results of the ED evaluation were notable for the following:    Pertinent lab results (reviewed and interpreted independently by me):   Labs Reviewed   BASIC METABOLIC PANEL - Abnormal; Notable for the following:     GLUCOSE 157 (*)     All other components within normal limits   CBC WITH DIFFERENTIAL - Abnormal; Notable for the following:     RED CELL COUNT 4.41 (*)     HEMOGLOBIN 12.4 (*)     HEMATOCRIT 38.5 (*)     All other components within normal limits   URINALYSIS-COMPLETE - Abnormal; Notable for the following:     PROTEIN URINE 30 (*)     MUCOUS/LPF MANY (*)     HYALINE CASTS 20 (*)     All other components within normal limits   CBC WITH DIFFERENTIAL - Abnormal; Notable for the following:     HEMOGLOBIN 12.9 (*)     HEMATOCRIT 39.3 (*)     All other components within normal limits   CBC NO DIFFERENTIAL   TYPE AND SCREEN      Pertinent imaging results (reviewed and interpreted independently by me):   CT ABDOMEN + PELVIS WITH CONTRAST   Colitis, pericolonic abscess    Pertinent medications:   NS 1000mL IV  Flagyl 500mg  IV  Rocephin 1g IV    Consults: A Consult was obtained from Acute Care Surgery       ED Course:   1800: the patient was  seen and evaluated   1805: rectal exam performed  1830: vitals remain stable   1910: ACS called  1940: ACS responded    Patient Summary:    Lance Robertson is a [age]yr old male with abdominal pain, recent diverticulitis treatment, persistent abdominal pain and  rectal bleeding.  Labs and vitals unremarkable.  CT abdomen obtained which shows evidence of colonic inflammation as well as peri-colonic fluid collection concerning for abscess.  Pt given IV abx, IV fluids and admiatted to ACS for definitive management of complex abdominal infection.        LAST VITAL SIGNS:  Temp: 36.9 C (98.4 F) (04/17/14 1613)  Temp src: Oral (04/17/14 1613)  Pulse: 61 (04/17/14 1613)  BP: 110/74 mmHg (04/17/14 1613)  Resp: 16 (04/17/14 1613)  SpO2: 100 % (04/17/14 1613)  Weight: 52.1 kg (114 lb 13.8 oz) (04/17/14 1042)      Clinical Impression:    Colitis  Peri-colonic abscess    Anticipated work up needed: abdominal infection      Disposition: Admit. Anticipate the patient will require greater than 2 nights of admission due to abscess.      MDM COMPLEXITY  Overall Complexity of MDM is: high      PRESENT ON ADMISSION:  Are any of the following four conditions present or suspected on admission: decubitus ulcer, infection from an intravascular device, infection due to an indwelling catheter, surgical site infection or pneumonia? colonic abscess    PATIENT'S GENERAL CONDITION:  Fair: Vital signs are stable and within normal limits. Patient is conscious but may be uncomfortable. Indicators are favorable.    DISCLAIMER:   This document serves as my personal record of services taken in my presence. It was created on 04/17/2014 on my behalf by Billy CoastAlex Gimelli, a trained medical scribe.     I have reviewed this document and agree that this note accurately reflects the history and exam findings, the patient care provided, and my medical decision making.    This patient was seen, evaluated, and the care plan was developed in conjunction with Fara OldenAlyrene Dorey, MD - Resident Physician. I agree with the findings and plan as outlined in our combined note.      04/17/2014  Electronically Signed By: Claudina LickSarah Elizabeth Luay Balding, MD  Attending Physician, Department of Emergency Medicine  [facility]

## 2014-04-17 NOTE — ED Nursing Note (Signed)
Pt cont to c/o lower abd pain, states he is still having small amount of bloody stool. Pt is aware he is going to be admitted, waiting for orders.

## 2014-04-17 NOTE — ED Nursing Note (Signed)
Pt moved to E4 via gurney, family at bedside. Report given to Clinton[place]andy, [place]. Transfer of care.

## 2014-04-18 DIAGNOSIS — K5720 Diverticulitis of large intestine with perforation and abscess without bleeding: Secondary | ICD-10-CM | POA: Insufficient documentation

## 2014-04-18 LAB — CBC WITH DIFFERENTIAL
BASOPHILS % AUTO: 0.5 %
BASOPHILS ABS AUTO: 0 10*3/uL (ref 0–0.2)
EOSINOPHIL % AUTO: 3 %
EOSINOPHIL ABS AUTO: 0.2 10*3/uL (ref 0–0.5)
HEMATOCRIT: 37.3 % — AB (ref 41–53)
HEMOGLOBIN: 12.3 g/dL — AB (ref 13.5–17.5)
LYMPHOCYTE ABS AUTO: 2.2 10*3/uL (ref 1.0–4.8)
LYMPHOCYTES % AUTO: 33.8 %
MCH: 28.8 pg (ref 27–33)
MCHC: 33.1 % (ref 32–36)
MCV: 87.2 UM3 (ref 80–100)
MONOCYTES % AUTO: 8 %
MONOCYTES ABS AUTO: 0.5 10*3/uL (ref 0.1–0.8)
MPV: 7.6 UM3 (ref 6.8–10.0)
NEUTROPHIL ABS AUTO: 3.5 10*3/uL (ref 1.80–7.70)
NEUTROPHILS % AUTO: 54.7 %
PLATELET COUNT: 305 10*3/uL (ref 130–400)
RDW: 12.6 U (ref 0–14.7)
RED CELL COUNT: 4.27 10*6/uL — AB (ref 4.5–5.9)
WHITE BLOOD CELL COUNT: 6.4 10*3/uL (ref 4.5–11.0)

## 2014-04-18 LAB — BASIC METABOLIC PANEL
CALCIUM: 8.9 mg/dL (ref 8.6–10.5)
CARBON DIOXIDE TOTAL: 26 meq/L (ref 24–32)
CHLORIDE: 102 meq/L (ref 95–110)
CREATININE BLOOD: 0.89 mg/dL (ref 0.44–1.27)
GLUCOSE: 96 mg/dL (ref 70–99)
POTASSIUM: 3.4 meq/L (ref 3.3–5.0)
SODIUM: 138 meq/L (ref 135–145)
UREA NITROGEN, BLOOD (BUN): 10 mg/dL (ref 8–22)

## 2014-04-18 MED ORDER — HYDROCODONE 5 MG-ACETAMINOPHEN 325 MG TABLET
1.0000 | ORAL_TABLET | ORAL | Status: DC | PRN
Start: 2014-04-18 — End: 2014-04-21
  Administered 2014-04-19 – 2014-04-20 (×2): 1 via ORAL
  Filled 2014-04-18 (×2): qty 1

## 2014-04-18 MED ORDER — DIPHENHYDRAMINE 50 MG/ML INJECTION SOLUTION
25.0000 mg | Freq: Four times a day (QID) | INTRAMUSCULAR | Status: DC | PRN
Start: 2014-04-18 — End: 2014-04-21

## 2014-04-18 MED ORDER — ONDANSETRON HCL (PF) 4 MG/2 ML INJECTION SOLUTION
4.0000 mg | Freq: Two times a day (BID) | INTRAMUSCULAR | Status: DC | PRN
Start: 2014-04-18 — End: 2014-04-21

## 2014-04-18 MED ORDER — SENNOSIDES 8.6 MG TABLET
2.0000 | ORAL_TABLET | Freq: Every day | ORAL | Status: DC
Start: 2014-04-18 — End: 2014-04-20
  Administered 2014-04-18: 17.2 mg via ORAL
  Filled 2014-04-18: qty 2

## 2014-04-18 MED ORDER — CEFTRIAXONE 1 GRAM SOLUTION FOR INJECTION
1.0000 g | INTRAMUSCULAR | Status: DC
Start: 2014-04-18 — End: 2014-04-21
  Administered 2014-04-18 – 2014-04-20 (×3): 1 g via INTRAVENOUS
  Filled 2014-04-18 (×3): qty 1

## 2014-04-18 MED ORDER — DOCUSATE SODIUM 100 MG CAPSULE
100.0000 mg | ORAL_CAPSULE | Freq: Two times a day (BID) | ORAL | Status: DC
Start: 2014-04-18 — End: 2014-04-20
  Administered 2014-04-18 – 2014-04-19 (×2): 100 mg via ORAL
  Filled 2014-04-18 (×2): qty 1

## 2014-04-18 MED ORDER — HEPARIN, PORCINE (PF) 5,000 UNIT/0.5 ML INJECTION SYRINGE
5000.0000 [IU] | INJECTION | Freq: Three times a day (TID) | INTRAMUSCULAR | Status: DC
Start: 2014-04-18 — End: 2014-04-18
  Administered 2014-04-18: 5000 [IU] via SUBCUTANEOUS
  Filled 2014-04-18: qty 1

## 2014-04-18 MED ORDER — D5 / 0.45% NACL W KCL 20 MEQ/L IV SOLUTION
INTRAVENOUS | Status: DC
Start: 2014-04-18 — End: 2014-04-19
  Administered 2014-04-18: 03:00:00 via INTRAVENOUS

## 2014-04-18 MED ORDER — POTASSIUM CHLORIDE ER 20 MEQ TABLET,EXTENDED RELEASE(PART/CRYST)
20.0000 meq | EXTENDED_RELEASE_TABLET | Freq: Once | ORAL | Status: AC
Start: 2014-04-18 — End: 2014-04-18
  Administered 2014-04-18: 20 meq via ORAL
  Filled 2014-04-18: qty 1

## 2014-04-18 MED ORDER — MORPHINE 2 MG/ML INJECTION SYRINGE
2.0000 mg | INJECTION | INTRAMUSCULAR | Status: DC | PRN
Start: 2014-04-18 — End: 2014-04-19

## 2014-04-18 MED ORDER — LACTOBACILLUS RHAMNOSUS GG 10 BILLION CELL CAPSULE
1.0000 | ORAL_CAPSULE | Freq: Every day | ORAL | Status: DC
Start: 2014-04-18 — End: 2014-04-21
  Administered 2014-04-19 – 2014-04-21 (×3): 1 via ORAL
  Filled 2014-04-18 (×3): qty 1

## 2014-04-18 MED ORDER — ZOLPIDEM 5 MG TABLET
5.0000 mg | ORAL_TABLET | Freq: Every day | ORAL | Status: DC | PRN
Start: 2014-04-18 — End: 2014-04-21

## 2014-04-18 MED ORDER — MORPHINE 2 MG/ML INJECTION SYRINGE
2.0000 mg | INJECTION | INTRAMUSCULAR | Status: DC | PRN
Start: 2014-04-18 — End: 2014-04-18
  Administered 2014-04-18: 2 mg via INTRAVENOUS
  Filled 2014-04-18: qty 1

## 2014-04-18 MED ORDER — METRONIDAZOLE 500 MG/100 ML IN SODIUM CHLOR(ISO) INTRAVENOUS PIGGYBACK
500.0000 mg | INJECTION | Freq: Two times a day (BID) | INTRAVENOUS | Status: DC
Start: 2014-04-18 — End: 2014-04-21
  Administered 2014-04-18 – 2014-04-20 (×6): 500 mg via INTRAVENOUS
  Filled 2014-04-18 (×6): qty 100

## 2014-04-18 MED ORDER — ENOXAPARIN 30 MG/0.3 ML SUBCUTANEOUS SYRINGE
30.0000 mg | INJECTION | Freq: Two times a day (BID) | SUBCUTANEOUS | Status: DC
Start: 2014-04-18 — End: 2014-04-21
  Administered 2014-04-18 – 2014-04-21 (×6): 30 mg via SUBCUTANEOUS
  Filled 2014-04-18 (×7): qty 0.3

## 2014-04-18 MED ORDER — POLYETHYLENE GLYCOL 3350 17 GRAM ORAL POWDER PACKET
17.0000 g | Freq: Every day | ORAL | Status: DC
Start: 2014-04-19 — End: 2014-04-21
  Administered 2014-04-19 – 2014-04-20 (×2): 17 g via ORAL
  Filled 2014-04-18 (×2): qty 1

## 2014-04-18 NOTE — Plan of Care (Signed)
Problem: Patient Care Overview (Adult)  Goal: Plan of Care Review  Outcome: Ongoing (interventions implemented as appropriate)  Goal Outcome Evaluation Note    Lance Robertson is a 73yrmale admitted 04/17/2014      OUTCOME SUMMARY AND PLAN MOVING FORWARD:   Pt is currently NPO for studies this AM to see what is causing bloody diarrhea and abd pain.   Goal: Individualization and Mutuality  Outcome: Ongoing (interventions implemented as appropriate)  Goal: Discharge Needs Assessment  Outcome: Ongoing (interventions implemented as appropriate)    Problem: Sleep Pattern Disturbance (Adult)  Goal: Identify Related Risk Factors and Signs and Symptoms  Related risk factors and signs and symptoms are identified upon initiation of Human Response Clinical Practice Guideline (CPG)   Outcome: Outcome(s) achieved Date Met:  04/18/14  Goal: Adequate Sleep/Rest  Patient will demonstrate the desired outcomes by discharge/transition of care.   Outcome: Ongoing (interventions implemented as appropriate)

## 2014-04-18 NOTE — Nurse Assessment (Signed)
ASSESSMENT NOTE    Note Started: 04/18/2014, 22:42     At 2010, Initial assessment completed and recorded in EMR.  Report received from day shift nurse and orders reviewed. Plan of Care reviewed and updated and appropriate, discussed with patient and spouse. Pt resting in bed. Wife at bedside attentive. Remind pt to report any difference of elimination pattern or stool color. Bed down. Call light in reach. IS encouraged. Room clear of cluster. Patrina LeveringXinyin Moise Friday, RN

## 2014-04-18 NOTE — Nurse Assessment (Signed)
ASSESSMENT NOTE    Note Started: 04/18/2014, 07:16     Initial assessment completed.  Report received from night shift nurse and orders reviewed. Plan of Care reviewed and appropriate, discussed with patient.  Bari EdwardEunice C Nazariah Cadet, RN

## 2014-04-18 NOTE — Nurse Transfer Note (Signed)
TRANSFER NOTE - RECEIVING    Note Started: 04/18/2014, 01:26     Report received from EMR. Patient received at 0120 hours from ED unit by gurney. Pt condition stable . patient and family oriented to room and unit. MD notified of patient's arrival on unit.  Plan of care reviewed and updated. VSS stable, pt reporting stomach pain, but refusing pain medicine. NPO Lance CamaraKywaita Asa Baudoin, RN

## 2014-04-18 NOTE — ED Nursing Note (Signed)
Patient admitted to D14 69-1. Documentation updated and complete. Patient report communicated via telephone. Patient transported via gurney by ED Tech. Patient belongings kept by patient.

## 2014-04-18 NOTE — Plan of Care (Signed)
Problem: Patient Care Overview (Adult)  Goal: Plan of Care Review  Outcome: Ongoing (interventions implemented as appropriate)  Goal Outcome Evaluation Note    Lance Robertson is a [age]yr male admitted 04/17/2014      OUTCOME SUMMARY AND PLAN MOVING FORWARD:     Patient was NPO but was started on clear liquid diet by the team, patient has Norco for pain, voids, urine culture sent today.  Goal: Discharge Needs Assessment  Outcome: Ongoing (interventions implemented as appropriate)    04/18/14 1737   Current Health   Anticipated Changes Related to Illness inability to care for self   Living Environment   Transportation Available family or friend will provide         Problem: Sleep Pattern Disturbance (Adult)  Goal: Adequate Sleep/Rest  Patient will demonstrate the desired outcomes by discharge/transition of care.   Outcome: Ongoing (interventions implemented as appropriate)    04/18/14 1737   Sleep Pattern Disturbance (Adult)   Adequate Sleep/Rest making progress toward outcome         Problem: Pain, Acute (Adult)  Goal: Identify Related Risk Factors and Signs and Symptoms  Related risk factors and signs and symptoms are identified upon initiation of Human Response Clinical Practice Guideline (CPG)  Outcome: Ongoing (interventions implemented as appropriate)    04/18/14 1737   Pain, Acute   Related Risk Factors (Acute Pain) fear;knowledge deficit   Signs and Symptoms (Acute Pain) constipation/diarrhea       Goal: Acceptable Pain Control/Comfort Level  Patient will demonstrate the desired outcomes by discharge/transition of care.  Outcome: Ongoing (interventions implemented as appropriate)    04/18/14 1737   Pain, Acute (Adult)   Acceptable Pain Control/Comfort Level making progress toward outcome

## 2014-04-18 NOTE — Consults (Addendum)
04/18/2014    My note reflects my evaluation of the patient at approximately 10:00 today.    I reviewed the note below of the Trauma and Emergency Surgery resident.  I personally saw the patient, repeated selected aspects of the history and physical examination, and reviewed relevant laboratory and imaging findings.  I discussed the assessment and plan with the resident and nurse practitioner team, with which I agree.    Abscess not amenable to percutaneous drainage, so will treat with antibiotics.  Oral intake acceptable since modest tenderness and normal WBC.    Report electronically signed by:  Angelita InglesGarth H. Mairi Stagliano, MD  Attending Physician  PI [phone]  Pager [phone]      ACUTE CARE SURGERY CONSULT  Date of ED Admission: 04/17/2014  5:30 PM    Time of Evaluation: 04/17/14 23:00   Attending: Dr. Maxwell CaulWisner    Requesting Physician or Service: ER       REASON FOR CONSULTATION: Evaluate pericolonic abscess    HISTORY OF PRESENT ILLNESS     [age]yr male presents with continued lower abdominal pain. [facility] for complicated diverticulitis. He had complained of three weeks of abdominal pain and diarrhea [facility]. He was found to have a long segment diverticulitis with a possible 1.5cm phlegmon/early abscess. Of note, patient was on cyclosporine for psorasis at that time. Patient was treated conservatively. He was discharged with antibiotic therapy and with recommendations for colonoscopy after 6 weeks.  However, patient continued to have lower abdominal pain. Pain was also associated with nausea and multiple loose bowel movements. Patient also reports episodes of bright red blood per rectum. Patient completed his 7 day course of antibiotics and returned to PCP. He was given another 10 day course of PO antibiotics but had little relief of her pain. Patient continues to feel febrile at times, but denies nausea and vomiting. Denies chest pain or shortness of  breath      Review of Systems:   Relevant data as above    Past Medical History:  Past Medical History   Diagnosis Date    HTN (hypertension)     Diverticulitis     Psoriasis        Past Surgical History:  No past surgical history on file.    Social History:   History     Social History    Marital Status: MARRIED     Spouse Name: N/A     Number of Children: N/A    Years of Education: N/A     Social History Main Topics    Smoking status: Former Smoker -- 0.50 packs/day     Types: Cigarettes     Quit date: 03/18/2014    Smokeless tobacco: Not on file    Alcohol Use: Not on file    Drug Use: Not on file    Sexual Activity: Not on file     Other Topics Concern    Not on file     Social History Narrative    No narrative on file       Family History:   No family history on file.    Outpatient Medications:  No current facility-administered medications on file prior to encounter.     No current outpatient prescriptions on file prior to encounter.       Inpatient Medications:    No current facility-administered medications for this encounter.        Allergies  Excedrin [Acetaminophen-Caffeine]    Hives    VITAL SIGNS   Temp: 37 C (98.6 F)  BP: 123/71 mmHg  Pulse: 57  Resp: 18  SpO2: 98 %      Weight: 52.1 kg (114 lb 13.8 oz) Body mass index is 19.11 kg/(m^2).    PHYSICAL EXAM  Gen: alert, oriented, appears comfortable  CV: normal rate, regular rhythm, no murmurs  Pulm: breathing comfortably on room air, clear to auscultation bilaterally  GI: Abdomen is soft, nondistended, minimal tenderness to palpation over suprapubic region. No rebound or guarding  Rectal: Poorly tolerated by patient. No mass or fluctuance appreciated. No gross blood  Vasc: DP pulses palpable bilaterally, no lower extremity edema    LAB TESTS  Recent labs for the past 72 hours     04/17/14 1750 04/17/14 1114    WHITE BLOOD CELL COUNT 11.2* 6.5    HEMOGLOBIN 12.9* 12.4*    HEMATOCRIT 39.3* 38.5*    PLATELET COUNT 338 299    APTT  -- --    INR -- --     Recent labs for the past 72 hours     04/17/14 1114    SODIUM 139    POTASSIUM 3.8    CHLORIDE 102    CARBON DIOXIDE TOTAL 28    UREA NITROGEN, BLOOD (BUN) 11    CREATININE BLOOD 1.06    GLUCOSE 157*    CALCIUM 9.2    MAGNESIUM (MG) --    PHOSPHORUS (PO4) --     POC Glucose, blood: --   HEPATIC FUNCTION PANEL   No results found for this basename: TP:*,ALB:*,TBIL:*,ALP:*,AST:*,ALT:*,BILID:* in the last 72 hours     IMAGING STUDIES  CT Abd/Pelvis (04/17/14): *This study has NOT been reviewed by an Attending Radiologist*  Diffusely thickened and edematous segment of sigmoid colon measuring  approximately 6 cm in length best noted on series 3 image 63 associated  with prominent adjacent inflammatory change and some surrounding free  fluid. Findings compatible with colitis, infectious versus inflammatory.    Adjacent to this segment of colon is an enhancing well-circumscribed mass  with focal hypodensities which measures approximately 2.5 x 2.5 x 2.7 cm  noted on series 601 image 38. This is concerning for a developing  pericolonic infected fluid collection in the setting of colitis. However it  is difficult to separate from the adjacent colon and a colonic mass cannot  be entirely excluded without adequate bowel preparation. No free air.  Normal appendix identified within right lower abdomen on series 601 image  31. No evidence of diverticula.    Tiny left renal hypodensity, indeterminate. Nonspecific misty mesentery  with several scattered, subcentimeter mesenteric lymph nodes.  Mild atheromatous disease of the aortoiliac vessels.  Coarse calcifications within prostate.  Mild multilevel degenerative disc disease without acute bony abnormality.        ASSESSMENT  53 yo M with persistent/recurrent complicated diverticulitis, small pericolonic abscess    RECOMMENDATION  - Admit to Med/Surg  - NPO, IVF  - Continue IV Abx therapy  - Will discuss with IR for possible drainage, but abscess in  a difficult position in the pelvis  - Patient and wife explained that surgery should be avoided during acute setting due to high probability of ostomy formation  - Ideally if patient improves, will plan for colonoscopy in 6 weeks to evaluate atypical presentation of colonic CA  - If patient worsens or fails to improve will consider surgical intervention for partial colectomy with  end ostomy    Staffed with on call Acute Care Surgery attending Dr. Maxwell CaulWisner, who agrees in principle with resident assessment and plan.    Thank you for the opportunity to participate in this patient's care.    Electronically signed at 00:49 on 04/18/2014 by:  Wendall StadePratik Mehta, MD  PGY-3  General Surgery  Surgical Consult Pager: [phone]

## 2014-04-18 NOTE — ED Nursing Note (Signed)
Report to Jodi GeraldsWilliam RN - care transferred

## 2014-04-18 NOTE — Progress Notes (Addendum)
04/19/2014    My note reflects my evaluation of the patient at approximately 10:00 today.    I reviewed the note below of the Trauma and Emergency Surgery resident.  I personally saw the patient, repeated selected aspects of the history and physical examination, and reviewed relevant laboratory and imaging findings.  I discussed the assessment and plan with the resident and nurse practitioner team, with which I agree.    Diverticulitis with small pelvic abscess--Feels less pain today.  Remains afebrile and WBC 5,000.  Continue antibiotics.  Oral intake acceptable.    Report electronically signed by:  Angelita InglesGarth H. Danamarie Minami, MD  Attending Physician  PI [phone]  Pager [phone]      ACUTE CARE SURGERY PHYSICIAN DAILY PROGRESS NOTE    Note Date and Time: 04/19/2014 04:21 Date of Admission: 04/17/2014  5:30 PM    MRN: [id number] Patient's PCP: No Pcp No Pcp      Summary   53 y/o man with persistent and recurrent complicated diverticulitis, now presenting with a small pericolonic abscess    INTERVAL HISTORY  - tolerating clears  - reports small spots of blood with his stool, pain with stooling  - Increased bowel care    Procedures:   none    Relevant PMH/PSH:   HTN, diverticulitis,     Vital Signs  Temp src:  [-]   Temp:  [36.7 C (98 F)-36.9 C (98.5 F)]   Pulse:  [53-56]   BP: (107-121)/(64-75)   Resp:  [16]   SpO2:  [97 %-98 %]     Temp: 36.9 C (98.5 F) (01/01 0016)  Temp src: Oral (01/01 0016)  Pulse: 55 (01/01 0016)  BP: 113/64 mmHg (01/01 0016)  Resp: 16 (01/01 0016)  SpO2: 98 % (01/01 0016)  Height: 165.1 cm (5\' 5" ) (12/31 0118)  Weight: 50.8 kg (111 lb 15.9 oz) (12/31 0118)    I/O's:  In: 400 [Oral:300; Crystalloid:100]  Out: 800 [Urine:800]    I/O Current Shift:  In: 560 [Oral:560]  Out: 1500 [Urine:1500]    PHYSICAL EXAM  Gen: Alert, interactive, in NAD  Card: Regular, rate, rhythm.  Pulm: Unlabored breathing.  Abd: Soft, mildly tender suprapubically, abdomen full  Ext: Warm, well-perfused. SCDs in place  Rectal: No masses,  lesions, no blood    Meds Scheduled   Ceftriaxone (ROCEPHIN) IV 1 g, IV, Q24H Now, Last Rate: 1 g (04/18/14 2038)  Docusate (COLACE) Capsule 100 mg, ORAL, BID  Enoxaparin (LOVENOX) Injection 30 mg, SUBCUTANEOUS, Q12H  Lactobacillus (CULTURELLE) Capsule 1 capsule, ORAL, QAM  Metronidazole (FLAGYL) 500 mg in NaCl (iso-osm) 100 mL IVPB, IV, Q12H Now, Last Rate: 500 mg (04/18/14 2148)  Polyethylene Glycol 3350 (MIRALAX) Oral Powder Packet 17 g, ORAL, QAM  Sennosides (SENOKOT) Tablet 17.2 mg, ORAL, Daily Bedtime      Meds PRN   DiphenhydrAMINE (BENADRYL) Injection 25-50 mg, IV, Q6H PRN  Hydrocodone 5 mg/Acetaminophen 325 mg (NORCO  5) Tablet 1-2 tablet, ORAL, Q4H PRN  Ondansetron (ZOFRAN) Injection 4 mg, IV, Q12H PRN  Zolpidem (AMBIEN) Tablet 5 mg, ORAL, Bedtime PRN      Allergies: Excedrin    INTERVAL LABS  Recent labs for the past 48 hours     04/18/14 0140 04/17/14 1750 04/17/14 1114    WHITE BLOOD CELL COUNT 6.4 11.2* 6.5    HEMOGLOBIN 12.3* 12.9* 12.4*    HEMATOCRIT 37.3* 39.3* 38.5*    PLATELET COUNT 305 338 --      Recent labs for the past  48 hours     04/18/14 0140 04/17/14 1114    SODIUM 138 139    POTASSIUM 3.4 3.8    CHLORIDE 102 102    CARBON DIOXIDE TOTAL 26 28    UREA NITROGEN, BLOOD (BUN) 10 11    CREATININE BLOOD 0.89 1.06    GLUCOSE 96 157*    MAGNESIUM (MG) -- --        No results found for this basename: INR:2,PTT:2 in the last 48 hours     No results found for this basename: TP:*,ALB:*,TBIL:*,ALP:*,AST:*,ALT:*,BILID:* in the last 48 hours    No results found for this basename: LIP:* in the last 72 hours  LAST URINALYSIS   Recent labs for the past 72 hours     04/17/14 1122    COLLECTION Clean Catch    COLOR Yellow    CLARITY Clear    PH URINE 6.0    OCCULT BLOOD URINE Negative    BILIRUBIN URINE Negative    KETONES Negative    GLUCOSE URINE Negative    PROTEIN URINE 30*    UROBILINOGEN. 2.0    NITRITE URINE Negative    LEUK. ESTERASE Negative      Recent Imaging:     04/18/2014  CT abdomen/pelvis  Diffusely thickened and edematous segment of sigmoid colon measuring  approximately 6 cm in length best noted on series 3 image 63 associated  with prominent adjacent inflammatory change and some surrounding free  fluid. Findings compatible with colitis, infectious versus inflammatory.    Adjacent to this segment of colon is an enhancing well-circumscribed mass  with focal hypodensities which measures approximately 2.5 x 2.5 x 2.7 cm  noted on series 601 image 38. This is concerning for a developing  pericolonic infected fluid collection in the setting of colitis. However it  is difficult to separate from the adjacent colon and a colonic mass cannot  be entirely excluded without adequate bowel preparation. No free air.  Normal appendix identified within right lower abdomen on series 601 image  31. No evidence of diverticula.    Tiny left renal hypodensity, indeterminate. Nonspecific misty mesentery  with several scattered, subcentimeter mesenteric lymph nodes.  Mild atheromatous disease of the aortoiliac vessels.  Coarse calcifications within prostate.  Mild multilevel degenerative disc disease without acute bony abnormality     Recent Cultures: none    ASSESSMENT/PLAN  53 y/o man with persistent and recurrent complicated diverticulitis, and small pericolonic abscess    # Diverticulitis with abscess  - CLD, consider advancing diet if tolerating  - abscess not amenable to percutaneous drainage   - IV abx  - will need colonoscopy in 6 weeks    Nutrition: Clear Liquid Diet IVF: SLIV   Foley/Voiding: voiding Lines: PIV   PT / OT: n/a DVT Proph: lovenox, ALPs, Ambulating   Glucose control: n/a ZOX:[id number]:Lactobacillus   Pain meds: norco Bowel Care: colace, senna, miralax   Wound: none Antibiotics: Ceftriaxone, Flagyl     Dispo: ward care    Juan QuamWeichen Xu, PGYI  [place] [facility] of [facility]  Pager: [phone]  PI: [id number]

## 2014-04-19 DIAGNOSIS — K5721 Diverticulitis of large intestine with perforation and abscess with bleeding: Principal | ICD-10-CM

## 2014-04-19 LAB — CBC WITH DIFFERENTIAL
BASOPHILS % AUTO: 0.7 %
BASOPHILS ABS AUTO: 0 10*3/uL (ref 0–0.2)
EOSINOPHIL % AUTO: 3.9 %
EOSINOPHIL ABS AUTO: 0.2 10*3/uL (ref 0–0.5)
HEMATOCRIT: 35.7 % — AB (ref 41–53)
HEMOGLOBIN: 11.8 g/dL — AB (ref 13.5–17.5)
LYMPHOCYTE ABS AUTO: 1.8 10*3/uL (ref 1.0–4.8)
LYMPHOCYTES % AUTO: 35.1 %
MCH: 28.9 pg (ref 27–33)
MCHC: 33.2 % (ref 32–36)
MCV: 87.1 UM3 (ref 80–100)
MONOCYTES % AUTO: 9 %
MONOCYTES ABS AUTO: 0.5 10*3/uL (ref 0.1–0.8)
MPV: 7.8 UM3 (ref 6.8–10.0)
NEUTROPHIL ABS AUTO: 2.6 10*3/uL (ref 1.80–7.70)
NEUTROPHILS % AUTO: 51.3 %
PLATELET COUNT: 281 10*3/uL (ref 130–400)
RDW: 12.4 U (ref 0–14.7)
RED CELL COUNT: 4.09 10*6/uL — AB (ref 4.5–5.9)
WHITE BLOOD CELL COUNT: 5.1 10*3/uL (ref 4.5–11.0)

## 2014-04-19 LAB — BASIC METABOLIC PANEL
CALCIUM: 9.1 mg/dL (ref 8.6–10.5)
CARBON DIOXIDE TOTAL: 28 meq/L (ref 24–32)
CHLORIDE: 103 meq/L (ref 95–110)
CREATININE BLOOD: 0.98 mg/dL (ref 0.44–1.27)
GLUCOSE: 97 mg/dL (ref 70–99)
POTASSIUM: 3.9 meq/L (ref 3.3–5.0)
SODIUM: 140 meq/L (ref 135–145)
UREA NITROGEN, BLOOD (BUN): 5 mg/dL — AB (ref 8–22)

## 2014-04-19 LAB — CULTURE URINE, BACTI

## 2014-04-19 LAB — CULTURE SURVEILLANCE, MRSA

## 2014-04-19 NOTE — Nurse Assessment (Signed)
ASSESSMENT NOTE    Note Started: 04/19/2014, 20:01     Initial assessment completed and recorded in EMR.  Report received from day shift nurse and orders reviewed. Plan of Care reviewed and appropriate, discussed with patient and family.  Stated he started to have multiple diarrhea after regular diet today. Stool sample sent for r/o C-diff. Education provided to patient and his wife due to language barrier ([place] speaking with some English), not sure how much information they understood. Instructed wife to ware yellow gown while in the room and wash hands with soap and water while out. Will notify to order c-diff precaution before lab result back. C/o 5/10 pain to abd. Norco given as ordered. See MAR. Rashes noticed to both hands. Itching scaly patches noticed to bilateral legs and lower back. Patient stated he had hx of Eczema. Per MD's note it was Psoriasis. Seen by Dermatologist.  Will continue monitor. Leeanne Deed, RN

## 2014-04-19 NOTE — Plan of Care (Signed)
Problem: Patient Care Overview (Adult)  Goal: Plan of Care Review  Outcome: Ongoing (interventions implemented as appropriate)  Goal Outcome Evaluation Note    Lance Robertson is a 64yr male admitted 04/17/2014      OUTCOME SUMMARY AND PLAN MOVING FORWARD:   Complaints of mild stomach pain after eating regular diet, No BM, no  N/V noted, up ad lib, had shower today.        Goal: Individualization and Mutuality  Outcome: Ongoing (interventions implemented as appropriate)  Goal: Discharge Needs Assessment  Outcome: Ongoing (interventions implemented as appropriate)    Problem: Sleep Pattern Disturbance (Adult)  Goal: Adequate Sleep/Rest  Patient will demonstrate the desired outcomes by discharge/transition of care.   Outcome: Ongoing (interventions implemented as appropriate)    Problem: Pain, Acute (Adult)  Goal: Acceptable Pain Control/Comfort Level  Patient will demonstrate the desired outcomes by discharge/transition of care.   Outcome: Ongoing (interventions implemented as appropriate)

## 2014-04-19 NOTE — Nurse Focus (Signed)
Pt states that he has been having frequent loose watery diarrhea small amount since he started regular diet at noon, team aware- will order C-diff. Will f/u.

## 2014-04-19 NOTE — Plan of Care (Signed)
Problem: Patient Care Overview (Adult)  Goal: Plan of Care Review  Outcome: Ongoing (interventions implemented as appropriate)  Goal Outcome Evaluation Note    Lance Robertson is a 55yr male admitted 04/17/2014      OUTCOME SUMMARY AND PLAN MOVING FORWARD:   Pain well controlled. Good fluid intake and output. Continue on IV antibiotics treatment.       Goal: Individualization and Mutuality  Outcome: Ongoing (interventions implemented as appropriate)  Goal: Discharge Needs Assessment  Outcome: Ongoing (interventions implemented as appropriate)    Problem: Sleep Pattern Disturbance (Adult)  Goal: Adequate Sleep/Rest  Patient will demonstrate the desired outcomes by discharge/transition of care.   Outcome: Ongoing (interventions implemented as appropriate)    Problem: Pain, Acute (Adult)  Goal: Identify Related Risk Factors and Signs and Symptoms  Related risk factors and signs and symptoms are identified upon initiation of Human Response Clinical Practice Guideline (CPG)   Outcome: Ongoing (interventions implemented as appropriate)  Goal: Acceptable Pain Control/Comfort Level  Patient will demonstrate the desired outcomes by discharge/transition of care.   Outcome: Ongoing (interventions implemented as appropriate)

## 2014-04-19 NOTE — Plan of Care (Signed)
Problem: Pain, Acute (Adult)  Goal: Identify Related Risk Factors and Signs and Symptoms  Related risk factors and signs and symptoms are identified upon initiation of Human Response Clinical Practice Guideline (CPG)   Outcome: Outcome(s) achieved Date Met:  04/19/14    04/19/14 1547   Pain, Acute   Related Risk Factors (Acute Pain) disease process   Signs and Symptoms (Acute Pain) constipation/diarrhea;sleep pattern alteration

## 2014-04-19 NOTE — Nurse Assessment (Signed)
ASSESSMENT NOTE    Note Started: 04/19/2014, 08:19     1610R  Initial assessment completed and recorded in EMR.  Report received from night shift nurse and orders reviewed. Plan of Care reviewed and appropriate, discussed with patient.  Rooney Swails, Charity fundraiser

## 2014-04-20 DIAGNOSIS — R197 Diarrhea, unspecified: Secondary | ICD-10-CM

## 2014-04-20 DIAGNOSIS — K5720 Diverticulitis of large intestine with perforation and abscess without bleeding: Secondary | ICD-10-CM

## 2014-04-20 LAB — CBC WITH DIFFERENTIAL
BASOPHILS % AUTO: 0.6 %
BASOPHILS ABS AUTO: 0 10*3/uL (ref 0–0.2)
EOSINOPHIL % AUTO: 2.7 %
EOSINOPHIL ABS AUTO: 0.2 10*3/uL (ref 0–0.5)
HEMATOCRIT: 35.2 % — AB (ref 41–53)
HEMOGLOBIN: 11.9 g/dL — AB (ref 13.5–17.5)
LYMPHOCYTE ABS AUTO: 2 10*3/uL (ref 1.0–4.8)
LYMPHOCYTES % AUTO: 28.7 %
MCH: 28.9 pg (ref 27–33)
MCHC: 33.9 % (ref 32–36)
MCV: 85.3 UM3 (ref 80–100)
MONOCYTES % AUTO: 8.1 %
MONOCYTES ABS AUTO: 0.6 10*3/uL (ref 0.1–0.8)
MPV: 7.5 UM3 (ref 6.8–10.0)
NEUTROPHIL ABS AUTO: 4.2 10*3/uL (ref 1.80–7.70)
NEUTROPHILS % AUTO: 59.9 %
PLATELET COUNT: 285 10*3/uL (ref 130–400)
RDW: 12.3 U (ref 0–14.7)
RED CELL COUNT: 4.13 10*6/uL — AB (ref 4.5–5.9)
WHITE BLOOD CELL COUNT: 7 10*3/uL (ref 4.5–11.0)

## 2014-04-20 LAB — BASIC METABOLIC PANEL
CALCIUM: 9.2 mg/dL (ref 8.6–10.5)
CARBON DIOXIDE TOTAL: 28 meq/L (ref 24–32)
CHLORIDE: 101 meq/L (ref 95–110)
CREATININE BLOOD: 0.94 mg/dL (ref 0.44–1.27)
GLUCOSE: 87 mg/dL (ref 70–99)
POTASSIUM: 3.8 meq/L (ref 3.3–5.0)
SODIUM: 139 meq/L (ref 135–145)
UREA NITROGEN, BLOOD (BUN): 8 mg/dL (ref 8–22)

## 2014-04-20 LAB — C DIFFICILE DIAGNOSTIC TEST

## 2014-04-20 LAB — HEPATIC FUNCTION PANEL
ALANINE TRANSFERASE (ALT): 21 U/L (ref 6–63)
ALBUMIN: 3.4 g/dL (ref 3.4–4.8)
ALKALINE PHOSPHATASE (ALP): 65 U/L (ref 35–115)
ASPARTATE TRANSAMINASE (AST): 33 U/L (ref 15–43)
BILIRUBIN TOTAL: 0.1 mg/dL — AB (ref 0.3–1.3)
PROTEIN: 7.2 g/dL (ref 6.3–8.3)

## 2014-04-20 NOTE — Plan of Care (Signed)
Problem: Patient Care Overview (Adult)  Goal: Plan of Care Review  Outcome: Ongoing (interventions implemented as appropriate)  Condition no change. Only had one diarrhea during this shift. Still waiting for c-diff result. Continue with c-diff isolation. Pain under control with current pain management. Will continue monitor.   Goal: Individualization and Mutuality  Outcome: Ongoing (interventions implemented as appropriate)  Goal: Discharge Needs Assessment  Outcome: Ongoing (interventions implemented as appropriate)    Problem: Sleep Pattern Disturbance (Adult)  Goal: Adequate Sleep/Rest  Patient will demonstrate the desired outcomes by discharge/transition of care.   Outcome: Ongoing (interventions implemented as appropriate)    Problem: Pain, Acute (Adult)  Goal: Acceptable Pain Control/Comfort Level  Patient will demonstrate the desired outcomes by discharge/transition of care.   Outcome: Ongoing (interventions implemented as appropriate)    Problem: Infection, Risk/Actual (Adult)  Goal: Infection Prevention/Resolution  Patient will demonstrate the desired outcomes by discharge/transition of care.  Outcome: Ongoing (interventions implemented as appropriate)

## 2014-04-20 NOTE — Progress Notes (Addendum)
04/20/2014    My note reflects my evaluation of the patient at approximately 10:00 today.    I reviewed the note below of the Trauma and Emergency Surgery resident.  I personally saw the patient, repeated selected aspects of the history and physical examination, and reviewed relevant laboratory and imaging findings.  I discussed the assessment and plan with the resident and nurse practitioner team, with which I agree.    Some diarrhea but pain improved.  Continue IV antibiotics.    Report electronically signed by:  Angelita Ingles, MD  Attending Physician  PI [phone]  Pager [phone]      ACUTE CARE SURGERY PHYSICIAN DAILY PROGRESS NOTE    Note Date and Time: 04/20/2014 05:13 Date of Admission: 04/17/2014  5:30 PM    MRN: [id number] Patient's PCP: No Pcp No Pcp      Summary   54 y/o man with persistent and recurrent complicated diverticulitis, now presenting with a small pericolonic abscess    INTERVAL HISTORY  No acute events overnight  Tolerating regular diet  Multiple loose bowel movements, c diff sent  Pain improving    Procedures:   None    Relevant PMH/PSH:   HTN, diverticulitis    Vital Signs  Temp src:  [-]   Temp:  [36.8 C (98.3 F)-36.9 C (98.4 F)]   Pulse:  [54-66]   BP: (108-117)/(65-72)   Resp:  [16-17]   SpO2:  [96 %-97 %]     Temp: 36.8 C (98.3 F) (01/02 0110)  Temp src: Oral (01/02 0110)  Pulse: 57 (01/02 0110)  BP: 116/65 mmHg (01/02 0110)  Resp: 16 (01/02 0110)  SpO2: 96 % (01/02 0110)  Height: 165.1 cm ( ) (12/31 0118)  Weight: 50.8 kg (111 lb 15.9 oz) (12/31 0118)    I/O's:  In: 3190 [Oral:2670; Crystalloid:520]  Out: 1750 [Urine:1750]    I/O Current Shift:  In: 300 [Oral:150; Crystalloid:150]  Out: -     BM x 11    PHYSICAL EXAM  Gen: Alert, interactive, in NAD  Pulm: Unlabored breathing.  Abd: Soft, mildly tender suprapubically  Ext: Warm, well-perfused. SCDs in place    Meds Scheduled   Ceftriaxone (ROCEPHIN) IV 1 g, IV, Q24H Now, Last Rate: 1 g (04/19/14 2105)  Enoxaparin (LOVENOX) Injection  30 mg, SUBCUTANEOUS, Q12H  Lactobacillus (CULTURELLE) Capsule 1 capsule, ORAL, QAM  Metronidazole (FLAGYL) 500 mg in NaCl (iso-osm) 100 mL IVPB, IV, Q12H Now, Last Rate: 500 mg (04/19/14 2220)  Polyethylene Glycol 3350 (MIRALAX) Oral Powder Packet 17 g, ORAL, QAM      Meds PRN   DiphenhydrAMINE (BENADRYL) Injection 25-50 mg, IV, Q6H PRN  Hydrocodone 5 mg/Acetaminophen 325 mg (NORCO  5) Tablet 1-2 tablet, ORAL, Q4H PRN  Ondansetron (ZOFRAN) Injection 4 mg, IV, Q12H PRN  Zolpidem (AMBIEN) Tablet 5 mg, ORAL, Bedtime PRN      Allergies: Excedrin    INTERVAL LABS  Recent labs for the past 48 hours     04/20/14 0110 04/19/14 0557    WHITE BLOOD CELL COUNT 7.0 5.1    HEMOGLOBIN 11.9* 11.8*    HEMATOCRIT 35.2* 35.7*    PLATELET COUNT 285 281      Recent labs for the past 48 hours     04/20/14 0110 04/19/14 0557    SODIUM 139 140    POTASSIUM 3.8 3.9    CHLORIDE 101 103    CARBON DIOXIDE TOTAL 28 28    UREA NITROGEN, BLOOD (BUN) 8 5*  CREATININE BLOOD 0.94 0.98    GLUCOSE 87 97    MAGNESIUM (MG) -- --        Recent labs for the past 72 hours     04/17/14 1122    COLLECTION Clean Catch    COLOR Yellow    CLARITY Clear    PH URINE 6.0    OCCULT BLOOD URINE Negative    BILIRUBIN URINE Negative    KETONES Negative    GLUCOSE URINE Negative    PROTEIN URINE 30*    UROBILINOGEN. 2.0    NITRITE URINE Negative    LEUK. ESTERASE Negative      Recent Imaging:   None    Recent Cultures:   04/19/14 C diff: Pending    ASSESSMENT/PLAN  54 y/o man with persistent and recurrent complicated diverticulitis, and small pericolonic abscess    # Diverticulitis with abscess  - Continue regular diet  - Abscess not amenable to percutaneous drainage   - IV abx (Day 4/7)  - Will need colonoscopy in 6 weeks    Nutrition: Regular Diet IVF: SLIV   Foley/Voiding: Voiding Lines: PIV   PT / OT: NA DVT Proph: Lovenox, ALPs, Ambulating   Glucose control: NA ZOX:[id number]   Pain meds: Norco Bowel Care: colace, senna, miralax   Wound: NA  Antibiotics: Ceftriaxone, Flagyl     Dispo: Ward care    Debara Pickett, MD  PGY-1, General Surgery  [facility]  Pager: [phone]  PI#: [phone]  Acute Care Surgery Service Pager [phone]

## 2014-04-20 NOTE — Plan of Care (Signed)
Problem: Patient Care Overview (Adult)  Goal: Plan of Care Review  Outcome: Ongoing (interventions implemented as appropriate)  Goal Outcome Evaluation Note    Lance Robertson is a 54yrmale admitted 04/17/2014      OUTCOME SUMMARY AND PLAN MOVING FORWARD:    up ad lib, had 3x small  BM today, still here for IV abs, Neg for C-diff        Goal: Individualization and Mutuality  Outcome: Ongoing (interventions implemented as appropriate)  Goal: Discharge Needs Assessment  Outcome: Ongoing (interventions implemented as appropriate)    Problem: Sleep Pattern Disturbance (Adult)  Goal: Adequate Sleep/Rest  Patient will demonstrate the desired outcomes by discharge/transition of care.   Outcome: Ongoing (interventions implemented as appropriate)    Problem: Pain, Acute (Adult)  Goal: Acceptable Pain Control/Comfort Level  Patient will demonstrate the desired outcomes by discharge/transition of care.   Outcome: Ongoing (interventions implemented as appropriate)    Problem: Infection, Risk/Actual (Adult)  Goal: Infection Prevention/Resolution  Patient will demonstrate the desired outcomes by discharge/transition of care.   Outcome: Outcome(s) achieved Date Met:  04/20/14  Pt is Negative for C-diff

## 2014-04-20 NOTE — Plan of Care (Signed)
Problem: Infection, Risk/Actual (Adult)  Goal: Identify Related Risk Factors and Signs and Symptoms  Related risk factors and signs and symptoms are identified upon initiation of Human Response Clinical Practice Guideline (CPG)  Outcome: Outcome(s) achieved Date Met:  04/20/14    04/20/14 0412   Infection, Risk/Actual   Infection, Risk/Actual: Related Risk Factors chronic illness/condition   Signs and Symptoms (Infection, Risk/Actual) feeding/eating intolerance;pain;other (see comments)  (diarrhea, abd pain)

## 2014-04-20 NOTE — Progress Notes (Addendum)
04/21/2014    My note reflects my evaluation of the patient at approximately 09:00 today.    I reviewed the note below of the Trauma and Emergency Surgery resident.  I personally saw the patient, repeated selected aspects of the history and physical examination, and reviewed relevant laboratory and imaging findings.  I discussed the assessment and plan with the resident and nurse practitioner team, with which I agree.    1. Diverticulitis with pelvic abscess--Has responded well to antibiotics.  Pain much less now.  Some diarrhea.  Eating well.  Afebrile and WBC normal.  Convert to oral antibiotics to complete a 14-day course.  Needs follow-up colonoscopy in 3 months to confirm diagnosis and rule out neoplastic process.  2. Disposition--We plan to discharge the patient from the hospital today.  We will provide discharge instructions and prescriptions as needed.    Report electronically signed by:  Angelita Ingles, MD  Attending Physician  PI [phone]  Pager [phone]      ACUTE CARE SURGERY PHYSICIAN DAILY PROGRESS NOTE    Note Date and Time: 04/21/2014 04:32 Date of Admission: 04/17/2014  5:30 PM    MRN: [id number] Patient's PCP: No Pcp No Pcp      Summary   54 y/o man with persistent and recurrent complicated diverticulitis, now presenting with a small pericolonic abscess    INTERVAL HISTORY  Pain improving  Loose BM with minimal mucous in stool x5  Tolerating regular diet  Afebrile    Procedures:   None    Relevant PMH/PSH:   HTN, diverticulitis    Vital Signs  Temp src:  [-]   Temp:  [36.4 C (97.6 F)-36.9 C (98.5 F)]   Pulse:  [51-71]   BP: (105-122)/(70-79)   Resp:  [16-18]   SpO2:  [96 %-99 %]     Temp: 36.9 C (98.5 F) (01/03 0204)  Temp src: Oral (01/03 0204)  Pulse: 55 (01/03 0204)  BP: 108/70 mmHg (01/03 0204)  Resp: 17 (01/03 0204)  SpO2: 98 % (01/03 0204)  Height: 165.1 cm ( ) (12/31 0118)  Weight: 50.8 kg (111 lb 15.9 oz) (12/31 0118)    I/O's:  In: 2400 [Oral:2150; Crystalloid:250]  Out: -     I/O Current  Shift:  In: 350 [Oral:350]  Out: -     BM x 5    PHYSICAL EXAM  Gen: Alert, interactive, in NAD  Pulm: Unlabored breathing.  Abd: Soft, minimally tender suprapubically  Ext: Warm, well-perfused. SCDs in place    Meds Scheduled   Ceftriaxone (ROCEPHIN) IV 1 g, IV, Q24H Now, Last Rate: 1 g (04/20/14 2112)  Enoxaparin (LOVENOX) Injection 30 mg, SUBCUTANEOUS, Q12H  Lactobacillus (CULTURELLE) Capsule 1 capsule, ORAL, QAM  Metronidazole (FLAGYL) 500 mg in NaCl (iso-osm) 100 mL IVPB, IV, Q12H Now, Last Rate: 0 mg (04/20/14 1120)  Polyethylene Glycol 3350 (MIRALAX) Oral Powder Packet 17 g, ORAL, QAM      Meds PRN   DiphenhydrAMINE (BENADRYL) Injection 25-50 mg, IV, Q6H PRN  Hydrocodone 5 mg/Acetaminophen 325 mg (NORCO  5) Tablet 1-2 tablet, ORAL, Q4H PRN  Ondansetron (ZOFRAN) Injection 4 mg, IV, Q12H PRN  Zolpidem (AMBIEN) Tablet 5 mg, ORAL, Bedtime PRN      Allergies: Excedrin    INTERVAL LABS  Recent labs for the past 48 hours     04/20/14 0110 04/19/14 0557    WHITE BLOOD CELL COUNT 7.0 5.1    HEMOGLOBIN 11.9* 11.8*    HEMATOCRIT 35.2* 35.7*  PLATELET COUNT 285 281      Recent labs for the past 48 hours     04/20/14 0110 04/19/14 0557    SODIUM 139 140    POTASSIUM 3.8 3.9    CHLORIDE 101 103    CARBON DIOXIDE TOTAL 28 28    UREA NITROGEN, BLOOD (BUN) 8 5*    CREATININE BLOOD 0.94 0.98    GLUCOSE 87 97    MAGNESIUM (MG) -- --          No results found for this basename: UACOLLECTION:1,UACOLOR:1,UACLARITY:1,UASG:1,UAPH:1,UAOCCULT:1,UABIL:1,UAKET:1,UAGLU:1,UAPRO:1,UAURO:1,UANIT:1,UALEUK:1 in the last 72 hours   Recent Imaging:   None    Recent Cultures:   04/19/14 C diff: Pending    ASSESSMENT/PLAN  54 y/o man with persistent and recurrent complicated diverticulitis, and small pericolonic abscess    # Diverticulitis with abscess  - Continue regular diet  - Abscess not amenable to percutaneous drainage   - IV abx (Day 5/7), continue  - Will need colonoscopy in 6 weeks    Nutrition: Regular Diet IVF: SLIV    Foley/Voiding: Voiding Lines: PIV   PT / OT: NA DVT Proph: Lovenox, ALPs, Ambulating   Glucose control: NA ZOX:[id number]   Pain meds: Norco Bowel Care: colace, senna, miralax   Wound: NA Antibiotics: Ceftriaxone, Flagyl     Dispo: Ward care    Ephriam Knuckles, MD  PGY-1, Dept. of Surgery  Service Pager: [phone]

## 2014-04-20 NOTE — Nurse Assessment (Signed)
ASSESSMENT NOTE    Note Started: 04/20/2014, 11:53     1610R  Initial assessment completed and recorded in EMR.  Report received from night shift nurse and orders reviewed. Plan of Care reviewed and appropriate, discussed with patient and wife at the bedside. Kept on Isolation to R/O c-diff- pending result. Up ad lib. Pain is minimal.c/o loose stool with mucoid particles, slight blood. Team aware- will continue to monitor.   Lariya Kinzie, Charity fundraiser

## 2014-04-21 ENCOUNTER — Other Ambulatory Visit: Payer: Self-pay

## 2014-04-21 DIAGNOSIS — L409 Psoriasis, unspecified: Secondary | ICD-10-CM

## 2014-04-21 LAB — CBC WITH DIFFERENTIAL
BASOPHILS % AUTO: 0.6 %
BASOPHILS ABS AUTO: 0 10*3/uL (ref 0–0.2)
EOSINOPHIL % AUTO: 3.5 %
EOSINOPHIL ABS AUTO: 0.2 10*3/uL (ref 0–0.5)
HEMATOCRIT: 37.3 % — AB (ref 41–53)
HEMOGLOBIN: 12.4 g/dL — AB (ref 13.5–17.5)
LYMPHOCYTE ABS AUTO: 1.4 10*3/uL (ref 1.0–4.8)
LYMPHOCYTES % AUTO: 27.1 %
MCH: 28.9 pg (ref 27–33)
MCHC: 33.2 % (ref 32–36)
MCV: 86.9 UM3 (ref 80–100)
MONOCYTES % AUTO: 9.5 %
MONOCYTES ABS AUTO: 0.5 10*3/uL (ref 0.1–0.8)
MPV: 8 UM3 (ref 6.8–10.0)
NEUTROPHIL ABS AUTO: 3 10*3/uL (ref 1.80–7.70)
NEUTROPHILS % AUTO: 59.3 %
PLATELET COUNT: 286 10*3/uL (ref 130–400)
RDW: 12.3 U (ref 0–14.7)
RED CELL COUNT: 4.29 10*6/uL — AB (ref 4.5–5.9)
WHITE BLOOD CELL COUNT: 5 10*3/uL (ref 4.5–11.0)

## 2014-04-21 LAB — BASIC METABOLIC PANEL
CALCIUM: 9.1 mg/dL (ref 8.6–10.5)
CARBON DIOXIDE TOTAL: 27 meq/L (ref 24–32)
CHLORIDE: 100 meq/L (ref 95–110)
CREATININE BLOOD: 0.91 mg/dL (ref 0.44–1.27)
GLUCOSE: 96 mg/dL (ref 70–99)
POTASSIUM: 3.8 meq/L (ref 3.3–5.0)
SODIUM: 137 meq/L (ref 135–145)
UREA NITROGEN, BLOOD (BUN): 9 mg/dL (ref 8–22)

## 2014-04-21 MED ORDER — SULFAMETHOXAZOLE 800 MG-TRIMETHOPRIM 160 MG TABLET
1.0000 | ORAL_TABLET | Freq: Two times a day (BID) | ORAL | Status: DC
Start: 2014-04-21 — End: 2014-04-21
  Administered 2014-04-21: 1 via ORAL
  Filled 2014-04-21: qty 1

## 2014-04-21 MED ORDER — SULFAMETHOXAZOLE 800 MG-TRIMETHOPRIM 160 MG TABLET
1.0000 | ORAL_TABLET | Freq: Two times a day (BID) | ORAL | 0 refills | Status: AC
Start: 2014-04-21 — End: 2014-04-30
  Filled 2014-04-21: qty 18, 9d supply, fill #0

## 2014-04-21 MED ORDER — METRONIDAZOLE 500 MG TABLET
500.0000 mg | ORAL_TABLET | Freq: Three times a day (TID) | ORAL | 0 refills | Status: AC
Start: 2014-04-21 — End: 2014-04-30
  Filled 2014-04-21: qty 27, 9d supply, fill #0

## 2014-04-21 MED ORDER — METRONIDAZOLE 500 MG TABLET
500.0000 mg | ORAL_TABLET | Freq: Three times a day (TID) | ORAL | Status: DC
Start: 2014-04-21 — End: 2014-04-21
  Administered 2014-04-21: 500 mg via ORAL
  Filled 2014-04-21: qty 1

## 2014-04-21 NOTE — Plan of Care (Signed)
Problem: Patient Care Overview (Adult)  Goal: Plan of Care Review  Outcome: Ongoing (interventions implemented as appropriate)  Goal: Individualization and Mutuality  Outcome: Ongoing (interventions implemented as appropriate)  Goal: Discharge Needs Assessment  Outcome: Ongoing (interventions implemented as appropriate)    Problem: Sleep Pattern Disturbance (Adult)  Goal: Adequate Sleep/Rest  Patient will demonstrate the desired outcomes by discharge/transition of care.   Outcome: Ongoing (interventions implemented as appropriate)    Problem: Pain, Acute (Adult)  Goal: Acceptable Pain Control/Comfort Level  Patient will demonstrate the desired outcomes by discharge/transition of care.   Outcome: Ongoing (interventions implemented as appropriate)

## 2014-04-21 NOTE — Plan of Care (Signed)
Problem: Patient Care Overview (Adult)  Goal: Plan of Care Review  Outcome: Outcome(s) achieved Date Met:  04/21/14    Comments:   Goal Outcome Evaluation Note    Lance Robertson is a 30yrmale admitted 04/17/2014      OUTCOME SUMMARY AND PLAN MOVING FORWARD:   Pt d/c home with 9 days of double ABx regimen

## 2014-04-21 NOTE — Nurse Assessment (Signed)
ASSESSMENT NOTE    Note Started: 04/20/2014, 2130, late entry     Initial assessment completed and recorded in EMR.  Report received from day shift nurse and orders reviewed. Plan of Care reviewed and appropriate, discussed with patient and family. Doing well. No acute distress. pain to lower abd improving. Tolerating regular diet. Continue having small amount loose BMs.   Ambulatory. Will continue monitor. Leeanne Deed, RN

## 2014-04-21 NOTE — Discharge Instructions (Signed)
DISCHARGE INSTRUCTIONS    Diagnosis: Complicated diverticulitis  Operation: None  Surgeon: None    MEDICATIONS  For pain please take tylenol alternating with ibuprofen, please do not exceed  of tylenol in one day.  You will finish a 9 day course of oral antibiotics at home, for a total 14 day course of antibiotics. Take Flagyl, and Bactrim DS as prescribed    ACTIVITY   You may resume all normal activitiy    DIET  You may resume a regular diet, you may be lactose intolerant, avoid dairy products to help with your loose stools.    RETURN   - Follow-up with Trauma and Emergency Surgery Clinic in the [facility] Building in 1 to 2 weeks, to schedule your appointment call call [phone].   - Go to the Emergency Department near you for any symptoms such as fevers with temperature greater than 101.5 F, chills, persistent nausea, vomiting, or worsening diarrhea, severe pain not controlled with medications, changes in mental status or for any acute problems or illnesses.  - You may call the Surgery Clinic at [phone] with questions or concerns.    ** Please follow up with your primary care doctor as soon as possible after discharge. You will need a colonoscopy 6 weeks after discharge to evaluate for any alternative causes for your diverticulitis.    Scheduled Appointments:    No future appointments.

## 2014-04-21 NOTE — Nurse Assessment (Signed)
ASSESSMENT NOTE    Note Started: 04/21/2014, 08:39     Initial assessment completed and recorded in EMR.  Report received from night shift nurse and orders reviewed. Plan of Care reviewed and appropriate, discussed with patient and family.  Jacqulyn Cane, Product manager

## 2014-04-28 NOTE — Discharge Summary (Signed)
ACUTE CARE SURGERY - DISCHARGE SUMMARY  Date of Admission:   04/17/2014  5:30 PM Date of Discharge 04/21/14   Admitting  Service: ACS Discharging Service: (A) Acute Care Surgery     Attending Physician at time of Discharge: No att. providers found        Discharge Diagnosis:  Complicated diverticulitis    Procedure(s) Performed:   none    Consultation(s):   None    Reason for Admission and Brief HPI:   As per Dr. Alfonse Ras HP 04/18/14    12yr male presents with continued lower abdominal pain. Patient was initially seen at [facility] on 03/21/14 for complicated diverticulitis. He had complained of three weeks of abdominal pain and diarrhea upon presentation to [facility]. He was found to have a long segment diverticulitis with a possible 1.5cm phlegmon/early abscess. Of note, patient was on cyclosporine for psorasis at that time. Patient was treated conservatively. He was discharged with antibiotic therapy and with recommendations for colonoscopy after 6 weeks.  However, patient continued to have lower abdominal pain. Pain was also associated with nausea and multiple loose bowel movements. Patient also reports episodes of bright red blood per rectum. Patient completed his 7 day course of antibiotics and returned to PCP. He was given another 10 day course of PO antibiotics but had little relief of her pain. Patient continues to feel febrile at times, but denies nausea and vomiting. Denies chest pain or shortness of breath    Hospital Course:  Patient admitted to ACS service ward care. Imaging was reviewed and her fluid collection is not amenable to percutaneous drainage. Patient was treated then with IV antibiotics. Patient was then started on ceftriaxone and flagyl and CLD which he tolerated well. His diet was advanced to regular as tolerated. His abdominal pain and diarrhea improved over the course of his hospital stay. He continued to be afebrile without a leukocytosis. Patient deemed clinically stable for  discharge on 04/21/13. Will finish with oral antibiotics at home for total course of 7 days. Will also plan for colonoscopy in 3 months.       Condition at discharge: Fair  Safe for discharge to: Home    Medications at time of Discharge:  Discharge Medication List as of 04/21/2014 11:46 AM      START taking these medications    Details   Metronidazole (FLAGYL) 500 mg Tablet Take 1 tablet by mouth 3 times daily. No alcohol, Disp-27 tablet, R-0, Handwritten Rx      Trimethoprim 160 mg/Sulfamethoxazole 800 mg (BACTRIM DS) 800-160 mg Tablet Take 1 tablet by mouth 2 times daily., Disp-18 tablet, R-0, Handwritten Rx         CONTINUE these medications which have NOT CHANGED    Details   Clonidine-Chlorthalidone (CLORPRES) 0.1-15 mg per tablet Take 1 tablet by mouth 2 times daily., Historical      CycloSPORINE NON-Modified (SANDIMMUNE) 100 mg Capsule Take 100 mg by mouth every day., 100 mg, Historical      Doxycycline 100 mg Capsule Capsule Take 100 mg by mouth 2 times daily., 100 mg, Historical      Losartan (COZAAR) 100 mg tablet Take 100 mg by mouth every day., 100 mg, Historical      Lovastatin (MEVACOR) 20 mg Tablet Take 20 mg by mouth every day at bedtime., Historical      Omeprazole (PRILOSEC) 20 mg Delayed Release Capsule Take 20 mg by mouth once daily before a meal., 20 mg, Historical  Discharge Instructions:  - Follow-up with Tra[facility] Clinic [facility] in 1 to 2 weeks, to schedule your appointment call call [phone].   - Go to the Emergency Department near you for any symptoms such as fevers with temperature greater than 101.5 F, chills, persistent nausea, vomiting, or worsening diarrhea, severe pain not controlled with medications, changes in mental status or for any acute problems or illnesses.   - You may call the Surgery Clinic at [phone] with questions or concerns.   ** Please follow up with your primary care doctor as soon as possible after discharge. You will  need a colonoscopy 6 weeks after discharge to evaluate for any alternative causes for your diverticulitis.      Recommended Follow Up Appointments:  Trauma and Emergency Surgery Services  [address]  [place] [id number]  [phone]  In 1 week         Scheduled Appointments:  No future appointments.   Juan QuamWeichen Alijah Hyde, PGYI  [place] Dept of Surgery  Pager: [phone]  PI: [phone]

## 2014-05-07 ENCOUNTER — Ambulatory Visit: Payer: MEDICAID

## 2014-05-13 DIAGNOSIS — L409 Psoriasis, unspecified: Secondary | ICD-10-CM

## 2014-05-13 DIAGNOSIS — I10 Essential (primary) hypertension: Secondary | ICD-10-CM

## 2014-05-13 DIAGNOSIS — K5720 Diverticulitis of large intestine with perforation and abscess without bleeding: Principal | ICD-10-CM | POA: Insufficient documentation

## 2014-05-13 LAB — CBC WITH DIFFERENTIAL
BASOPHILS % AUTO: 0.6 %
BASOPHILS ABS AUTO: 0 10*3/uL (ref 0–0.2)
EOSINOPHIL % AUTO: 1.3 %
EOSINOPHIL ABS AUTO: 0.1 10*3/uL (ref 0–0.5)
HEMATOCRIT: 36.9 % — AB (ref 41–53)
HEMOGLOBIN: 11.9 g/dL — AB (ref 13.5–17.5)
LYMPHOCYTE ABS AUTO: 1.4 10*3/uL (ref 1.0–4.8)
LYMPHOCYTES % AUTO: 26.8 %
MCH: 27.6 pg (ref 27–33)
MCHC: 32.1 % (ref 32–36)
MCV: 86 UM3 (ref 80–100)
MONOCYTES % AUTO: 5.7 %
MONOCYTES ABS AUTO: 0.3 10*3/uL (ref 0.1–0.8)
MPV: 7.4 UM3 (ref 6.8–10.0)
NEUTROPHIL ABS AUTO: 3.4 10*3/uL (ref 1.80–7.70)
NEUTROPHILS % AUTO: 65.6 %
PLATELET COUNT: 307 10*3/uL (ref 130–400)
RDW: 12.9 U (ref 0–14.7)
RED CELL COUNT: 4.3 10*6/uL — AB (ref 4.5–5.9)
WHITE BLOOD CELL COUNT: 5.2 10*3/uL (ref 4.5–11.0)

## 2014-05-13 LAB — BASIC METABOLIC PANEL
CALCIUM: 9 mg/dL (ref 8.6–10.5)
CARBON DIOXIDE TOTAL: 27 meq/L (ref 24–32)
CHLORIDE: 104 meq/L (ref 95–110)
CREATININE BLOOD: 1.17 mg/dL (ref 0.44–1.27)
GLUCOSE: 217 mg/dL — AB (ref 70–99)
POTASSIUM: 3.4 meq/L (ref 3.3–5.0)
SODIUM: 139 meq/L (ref 135–145)
UREA NITROGEN, BLOOD (BUN): 11 mg/dL (ref 8–22)

## 2014-05-13 NOTE — ED Nursing Note (Signed)
No change in status.

## 2014-05-13 NOTE — ED Nursing Note (Signed)
No change in status.  con't to report abd pain.  Ambulates with steady upright gait.

## 2014-05-13 NOTE — ED Progress Note (Signed)
Emergency Department Triage Note     Subjective: Lance Robertson is a [age]yr old male who presents to the ED with a chief complaint of bloody diarrhea.  Still having LLQ pain.     Patient's phone number confirmed - .     Objective (pertinent exam findings):    BP 145/77 mmHg  Pulse 80  Temp(Src) 36.7 C (98.1 F) (Oral)  Resp 16  Wt 46.267 kg (102 lb)  SpO2 98%     General: No acute distress, no distress.    Assessment: Lance Reilrung Serratore is a [age]yr old male with bloody diarrhea multiple times per day. History of perf diverticulitis.      Plan: Initial studies to evaluate this problem will include labs. Further workup will then proceed in patient care area c/d. ?  Seen and initially evaluated by:  Reubin MilanEric Alan Ramin Zoll, MD, Attending Physician, Department of Emergency Medicine       This patient was seen by a physician in triage. Please see the ED Initial Note for full details of this patient's care. This note covers the brief initial evaluation performed to obtain initial studies to expedite the patient's care. It is not intended to be a comprehensive document of the patient's ED visit.    This patient was instructed that this preliminary assessment and any studies obtained do not constitute a full workup of the patient's chief complaint. The patient was instructed to wait in the assigned area after the triage evaluation to be reevaluated by a physician after initial studies are completed.

## 2014-05-13 NOTE — ED Triage Note (Signed)
Pt ambulated into triage with steady gait; hx diverticulitis; c/o abd pain and bloody diarrhea; abd soft and nontender to palpation; denies fevers

## 2014-05-14 ENCOUNTER — Encounter: Payer: Self-pay | Admitting: EMERGENCY MEDICINE

## 2014-05-14 ENCOUNTER — Emergency Department (EMERGENCY_DEPARTMENT_HOSPITAL): Payer: MEDICAID

## 2014-05-14 ENCOUNTER — Emergency Department
Admission: EM | Admit: 2014-05-14 | Discharge: 2014-05-14 | Disposition: A | Discharge status: Home / Self Care | Payer: MEDICAID | Attending: Emergency Medicine | Admitting: Emergency Medicine

## 2014-05-14 DIAGNOSIS — K5720 Diverticulitis of large intestine with perforation and abscess without bleeding: Secondary | ICD-10-CM | POA: Insufficient documentation

## 2014-05-14 DIAGNOSIS — K5732 Diverticulitis of large intestine without perforation or abscess without bleeding: Secondary | ICD-10-CM

## 2014-05-14 DIAGNOSIS — K921 Melena: Secondary | ICD-10-CM

## 2014-05-14 HISTORY — DX: Dermatitis, unspecified: L30.9

## 2014-05-14 LAB — COMPREHENSIVE METABOLIC PANEL
ALANINE TRANSFERASE (ALT): 14 U/L (ref 6–63)
ALBUMIN: 3.8 g/dL (ref 3.4–4.8)
ALKALINE PHOSPHATASE (ALP): 61 U/L (ref 35–115)
ASPARTATE TRANSAMINASE (AST): 20 U/L (ref 15–43)
BILIRUBIN TOTAL: 0.6 mg/dL (ref 0.3–1.3)
CALCIUM: 9 mg/dL (ref 8.6–10.5)
CARBON DIOXIDE TOTAL: 27 meq/L (ref 24–32)
CHLORIDE: 102 meq/L (ref 95–110)
CREATININE BLOOD: 0.84 mg/dL (ref 0.44–1.27)
GLUCOSE: 98 mg/dL (ref 70–99)
POTASSIUM: 3.7 meq/L (ref 3.3–5.0)
PROTEIN: 7.6 g/dL (ref 6.3–8.3)
SODIUM: 136 meq/L (ref 135–145)
UREA NITROGEN, BLOOD (BUN): 8 mg/dL (ref 8–22)

## 2014-05-14 LAB — CBC NO DIFFERENTIAL
HEMATOCRIT: 38.3 % — AB (ref 41–53)
HEMOGLOBIN: 12.5 g/dL — AB (ref 13.5–17.5)
MCH: 27.8 pg (ref 27–33)
MCHC: 32.8 % (ref 32–36)
MCV: 84.8 UM3 (ref 80–100)
MPV: 7.4 UM3 (ref 6.8–10.0)
PLATELET COUNT: 305 10*3/uL (ref 130–400)
RDW: 13.2 U (ref 0–14.7)
RED CELL COUNT: 4.51 10*6/uL (ref 4.5–5.9)
WHITE BLOOD CELL COUNT: 6 10*3/uL (ref 4.5–11.0)

## 2014-05-14 LAB — PHOSPHORUS (PO4): PHOSPHORUS (PO4): 3.5 mg/dL (ref 2.4–5.0)

## 2014-05-14 LAB — INR: INR: 0.98 (ref 0.87–1.18)

## 2014-05-14 LAB — MAGNESIUM (MG): MAGNESIUM (MG): 2.1 mg/dL (ref 1.5–2.6)

## 2014-05-14 LAB — APTT STUDIES: APTT: 31.7 s (ref 24.1–36.7)

## 2014-05-14 MED ORDER — NACL 0.9% IV BOLUS - DURATION REQ
1000.0000 mL | Freq: Once | INTRAVENOUS | Status: AC
Start: 2014-05-14 — End: 2014-05-14
  Administered 2014-05-14: 1000 mL via INTRAVENOUS

## 2014-05-14 MED ORDER — CIPROFLOXACIN 500 MG TABLET
500.0000 mg | ORAL_TABLET | Freq: Two times a day (BID) | ORAL | Status: DC
Start: 2014-05-14 — End: 2014-05-14

## 2014-05-14 MED ORDER — IOHEXOL 350 MG IODINE/ML INTRAVENOUS SOLUTION
100.0000 mL | INTRAVENOUS | Status: AC
Start: 2014-05-14 — End: 2014-05-14
  Administered 2014-05-14: 100 mL via INTRAVENOUS

## 2014-05-14 MED ORDER — METRONIDAZOLE 500 MG/100 ML IN SODIUM CHLOR(ISO) INTRAVENOUS PIGGYBACK
500.0000 mg | INJECTION | Freq: Once | INTRAVENOUS | Status: AC
Start: 2014-05-14 — End: 2014-05-14
  Administered 2014-05-14: 11:00:00 500 mg via INTRAVENOUS
  Filled 2014-05-14: qty 100

## 2014-05-14 MED ORDER — METRONIDAZOLE 500 MG TABLET
500.0000 mg | ORAL_TABLET | Freq: Three times a day (TID) | ORAL | Status: DC
Start: 2014-05-14 — End: 2014-05-14

## 2014-05-14 MED ORDER — CEFTRIAXONE 1 GRAM SOLUTION FOR INJECTION
1.0000 g | Freq: Once | INTRAMUSCULAR | Status: AC
Start: 2014-05-14 — End: 2014-05-14
  Administered 2014-05-14: 1 g via INTRAVENOUS
  Filled 2014-05-14: qty 1

## 2014-05-14 MED ORDER — METRONIDAZOLE 500 MG TABLET
500.0000 mg | ORAL_TABLET | Freq: Three times a day (TID) | ORAL | Status: AC
Start: 2014-05-14 — End: 2014-05-24

## 2014-05-14 MED ORDER — CIPROFLOXACIN 500 MG TABLET
500.0000 mg | ORAL_TABLET | Freq: Two times a day (BID) | ORAL | Status: AC
Start: 2014-05-14 — End: 2014-05-24

## 2014-05-14 NOTE — Consults (Addendum)
ACUTE CARE SURGERY CONSULT NOTE  I have seen and evaluated this patient on 05/14/2014.  I have reviewed the history and physical examination recorded by the Trauma & Emergency Surgery Resident and Nurse Practitioner team including relevant laboratory and imaging studies. I have verified and discussed their findings and together we have developed the following assessment and plan.    54 year old [place] man who was [facility] from 12/30- 1/3.  He has continued to have diarrhea, blood streaked stools.  He came to ED because he had difficulty in making an appointment due to his insurance.  Of note he is on cyclosporine for psoriasis.    Repeat CT scan shows Marked circumferential wall thickening of the sigmoid colon which is  mildly worsened compared to prior CT, with adjacent inflammatory changes  and adjacent 3.1 x 3.2 x 1.8 cm soft tissue density. Findings may represent  perforated diverticular abscess and phlegmon formation, however, given the  lack of colonic diverticula and the enlarged perirectal lymph node, the  possibility of perforated colonic mass cannot be excluded.    He is afebrile and with a normal WBC.  His abdomen is benign.      Assessment/Plan:  Worry is that this may represent a malignancy or other IBD rather than diverticulitis.  He will need colonoscopy and possible bowel resection depending upon the findings.  Dr. Merri BrunetteMoneyhon has worked with discharge planning to discuss with PCP the importance that this patient needs to be referred to an appropriate surgeon.  We have also sent a preauthorization to have him seen in the ACS clinic by Dr. Jeanella CrazePierce on 2/2 as follow up from his [place] admission in early January.    Report Electronically Signed By  Elmarie Mainlandhristine S Shed Nixon, MD  Professor of Surgery  PI # [id number]        Date of Service:  05/14/2014    Surgery Attending: Carole Deere    Reason for Consult:  Persistent Diverticulitis    GENERAL  INFORMATION  Patient: Lance Robertson DOA: 05/14/2014  3:27 AM    MRN: [id number] LOS:  LOS: 0 days          HISTORY OF PRESENT ILLNESS     54 yo M with h/o persistent/recurrent complicated diverticulitis who presented to the ED yesterday morning with persistent abdominal pain and bloody diarrhea. Symptoms started about 2 months ago. He was treated at [facility] for acute complicated sigmoid diverticulitis with possible early microabscess of 1.5 cm on 03/21/14. Was treated conservatively with antibiotics and cyclosporin was held (which he takes for psoriasis). Symptoms persisted and was [facility] by the ACS service from 04/17/14-04/21/14. CT at that time showed sigmoid colon thickening and a 2.5 cm abscess/phlegmon that was not amenable to percutaneous drainage. He was again treated with conservative medical management and an additional course of antibiotics.      Since discharge from the hospital he has had no improvement of symptoms. He has completed his third course of antibiotics and continues to have frequent loose BMs (up to 20-30/day) and intermittent low abdominal pain.  Pain is suprapubic to RLQ, intermittent, non-radiating, and most severe during and after BMs. Also worse after eating and he has had decreased PO intake. 6/10 in severity at times. He often strains during BMs with a sensation of incomplete evacuation. Associated with some nausea, no vomiting. Complains of subjective fevers at home. Denies chest pain or shortness of breath.  He has never had a colonoscopy and is scheduled for one in March.    Since he has presented to the emergency department yesterday morning, has remained afebrile, normotensive, with no tachycardia. Labs reveal no leukocytosis or left shift. CT imaging confirms persistent sigmoid diverticulitis with adjacent abscess/phlegmon vs. Possible mass. (official read pending).        Past Medical History:  Past Medical History   Diagnosis Date    HTN (hypertension)      Diverticulitis     Psoriasis     Eczema        Past Surgical History:  Past Surgical History   Procedure Laterality Date    No surgical history         Social History:  History     Social History    Marital Status: MARRIED     Spouse Name: N/A     Number of Children: N/A    Years of Education: N/A     Occupational History    Not on file.     Social History Main Topics    Smoking status: Former Smoker -- 0.50 packs/day     Types: Cigarettes     Quit date: 03/18/2014    Smokeless tobacco: Former Neurosurgeon     Quit date: 03/21/2014    Alcohol Use: Not on file    Drug Use: No    Sexual Activity: Not on file     Other Topics Concern    Not on file     Social History Narrative       Family History:  No family history of cancer or irritable bowel disease    Allergies:    Excedrin [Acetaminophen-Caffeine]    Hives    Outpatient Medications:  No current facility-administered medications on file prior to encounter.     Current Outpatient Prescriptions on File Prior to Encounter   Medication Sig Dispense Refill    Clonidine-Chlorthalidone (CLORPRES) 0.1-15 mg per tablet Take 1 tablet by mouth 2 times daily.      CycloSPORINE NON-Modified (SANDIMMUNE) 100 mg Capsule Take 100 mg by mouth every day.      Doxycycline 100 mg Capsule Capsule Take 100 mg by mouth 2 times daily.      Losartan (COZAAR) 100 mg tablet Take 100 mg by mouth every day.      Lovastatin (MEVACOR) 20 mg Tablet Take 20 mg by mouth every day at bedtime.      Omeprazole (PRILOSEC) 20 mg Delayed Release Capsule Take 20 mg by mouth once daily before a meal.         Review of Systems:  10 point review of systems performed, notables in the HPI     VITAL SIGNS / INPUTS & OUTPUTS  BP 138/96 mmHg  Pulse 64  Temp(Src) 37 C (98.6 F) (Oral)  Resp 16  Wt 46.267 kg (102 lb)  SpO2 99%    PHYSICAL EXAM  Gen: healthy, alert, no distress, cooperative  HEENT: PERRLA, EOMI, fundi benign  Neck: normal, supple and no adenopathy  CV: RRR  Pulm: clear  Abd: soft,  nondistended, no surgical scars. Very mild tenderness to palpation in the suprapubic and RLQ, no rebound or guarding.   Ext: no edema, well perfused. 2+ radial, femoral, popliteal, DP, and PT pulses  RECTAL: good tone, visible mild eternal hemorrhoids, no palpable masses, no gross blood on exam  Skin: dry scaly patches mostly on back and lower extremities consistent with history of psoriasis  Neuro: normal  without focal findings, mental status, speech normal, alert and oriented x4, PERRL      LAB TESTS / IMAGING STUDIES  Lab Results   Lab Name Value Date/Time    WBC 6.0 05/14/2014 08:09 AM    HGB 12.5* 05/14/2014 08:09 AM    HCT 38.3* 05/14/2014 08:09 AM    PLT 305 05/14/2014 08:09 AM         BASIC METABOLIC PANEL   Recent labs for the past 72 hours     05/14/14 0809 05/13/14 1152    GLUCOSE 98 217*    UREA NITROGEN, BLOOD (BUN) 8 11    CREATININE BLOOD 0.84 1.17    SODIUM 136 139    POTASSIUM 3.7 3.4    CHLORIDE 102 104    CARBON DIOXIDE TOTAL 27 27    CALCIUM 9.0 9.0        Lab Results   Lab Name Value Date/Time    AST 20 05/14/2014 08:09 AM    ALT 14 05/14/2014 08:09 AM    ALP 61 05/14/2014 08:09 AM    ALB 3.8 05/14/2014 08:09 AM    TP 7.6 05/14/2014 08:09 AM    TBIL 0.6 05/14/2014 08:09 AM       Imaging:  CT ABD/PELVIS: (PRELIM READ):  Findings:  The visualized lung bases are clear. The heart is normal in size, and there  is no pericardial effusion.    The liver is normal in size. There is no intrahepatic biliary ductal  dilatation. The gallbladder, spleen, pancreas, and bilateral adrenal glands  are grossly unremarkable. The bilateral kidneys are grossly unremarkable,  without hydronephrosis or perinephric fat stranding.    Scattered atelectatic calcification is again noted within the nonaneurysmal  abdominal aorta. Unchanged nonspecific haziness of the mesentery within the  mid abdomen (best demonstrated on series 3, image 39). There are several  small scattered mesenteric lymph nodes which are not  pathologically  enlarged.    There is no free intraperitoneal air or free intraperitoneal fluid.    Marked circumferential wall thickening is again noted in the sigmoid colon,  with adjacent fat stranding/inflammatory changes. This is slightly worsened  compared to the prior exam. The masslike area of sigmoid colon wall  thickening measures approximately 6.6 x 4.1 cm in maximum axial dimensions  (series 3, image 65). Adjacent to the sigmoid colon on the right, there is  a well-circumscribed soft tissue density measuring approximately 3.2 x 1.8  cm in maximum axial dimensions (series 3, image 65), and 3.1 cm in  craniocaudal dimension (series 601, image 42), grossly unchanged compared  to prior exam. There is a single enlarged perirectal lymph node measuring  up to 1 cm in short axis (series 3, image 64).    There is a large amount of retained colonic stool. There are no dilated  bowel loops to suggest bowel obstruction. The appendix is visualized in the  right lower abdominal quadrant and is mildly enlarged, measuring up to 8 mm  in diameter, but appears air-filled and otherwise normal.    Coarse calcifications are again noted within the prostate gland. The  urinary bladder is mildly distended with fluid and is otherwise  unremarkable. No free fluid is seen within the pelvis.    There are no acute bony abnormalities or suspicious osseous lesions. Mild  degenerative changes are again noted within the thoracolumbar spine.    IMPRESSION:  1. Marked circumferential wall thickening of the sigmoid colon which is  mildly worsened compared  to prior CT, with adjacent inflammatory changes  and adjacent 3.1 x 3.2 x 1.8 cm soft tissue density. Findings may represent  perforated diverticular abscess and phlegmon formation, however, given the  lack of colonic diverticula and the enlarged perirectal lymph node, the  possibility of perforated colonic mass cannot be excluded. Recommend  further evaluation with colonoscopy.  2.  Stable nonspecific haziness of the mesentery.      ASSESSMENT/PLAN:  54 yo M with persistent complicated sigmoid diverticulitis with small pericolonic phlegmon/abscess vs. Mass.  Hemorrhoids..    - Repeat labs stable  - Prior eval for Cdif was negative, will need repeat testing  - F/u Final CT read - abscess/phlegmon vs. mass does not look significantly different from prior admission and probably is still not amenable to percutaneous drainage, appearance more concerning for malignancy now  - Will need colonoscopy to evaluate for malignancy prior to surgical intervention  - Surgical intervention may be indicated given persistent symptoms without improvement after multiple courses of antibiotics. Malignancy is still a concern as well. There are no acute issues that will require admission to the hospital, but will need close outpatient follow-up with a colorectal surgeon. Unfortunately, due to insurance reasons will not be able to follow up with [place] at [facility]. Working with the Case Managers in the ED we have set up an appointment with his PCP Dr. Leonia Corona who will see him tomorrow morning at 10:00 with plans to refer him to a Colorectal Specialist within his system. Most likely will be referred to Dr. Ileene Musa in [facility] for further workup and treatment.   - We also scheduled an appointment for him to follow up with Dr. Jeanella Craze in Trauma Surgery clinic on 05/21/14 at 13:20. He should try to stay within his insurance system if possible, but he may keep this appointment as a backup if he is unable to be seen within a reasonable amount of time.   This was discussed with the patient, his wife, and his english speaking daughter in detail. All questions answered.      Electronically Signed on 05/14/2014 at 08:09 by:  Redgie Grayer, MD  General Surgery, PGY-4  Trauma/ACS  Service Pager [phone]

## 2014-05-14 NOTE — ED Nursing Note (Signed)
Pt received AVS from ER MD and RX.  Discharged as ordered.  IV discontinued.

## 2014-05-14 NOTE — ED Nursing Note (Signed)
Received report from night shift.  Pt lying on a gurney in the hallway.  No distress noted.

## 2014-05-14 NOTE — ED Nursing Note (Signed)
[facility]. Pt AxO x4, appears comfortable.  C/o lower abd pain which is currently tolerable.  Ambulated to BR w/ steady gait.

## 2014-05-14 NOTE — ED Nursing Note (Signed)
No change in status.  Ambulates with steady upright giat.

## 2014-05-14 NOTE — Discharge Instructions (Signed)
You were evaluated for your abdominal pain.  You appear to have recurrent, persistent diverticulitis.    Our surgery team evaluated you and recommended you to follow-up with your PCP tomorrow.  They think you need to be referred to surgery, or follow-up with our surgery department.  You may need a colonoscopy soon.    They would like you to start another 10 day course of antibiotics to keep the inflammation / infection down until your follow-up is established:  - ciprofloxacin twice daily  - metronidazole 500 every 8 hours    We are unsure about why you are taking doxycycline - please discuss resuming / continuing this medication with your PCP

## 2014-05-14 NOTE — ED Initial Note (Signed)
EMERGENCY DEPARTMENT PHYSICIAN NOTE - Lance Robertson       Date of Service:   05/14/2014  3:27 AM Patient's PCP: No Pcp No Pcp   Note Started: 05/14/2014 04:02 DOB: August 02, 1960             Chief Complaint   Patient presents with    Diarrhea Non Acute       The history provided by the patient and daughter.  Interpreter used: Yes Other- daughter    Lance Robertson is a 54yr old male, with a past medical history significant for Diverticulitis, HTN, and psoriasis, who presents to the ED with a chief complaint of bloody diarrhea that began over 2 months ago. The patient has a history of diverticulitis with a small abscess diagnosed at an OSH 2 months ago. [facility] and treated conservatively with IV antibiotics, as abscess was in difficult location for IR intervention. Discharged with po antibiotics as he was tolerating po intake and well appearing. He reports that the bloody diarrhea and abdominal pain have been worsening since then. States he has 20-30 episodes per day of bright red blood in diarrhea.    The patient currently endorses constant, gradually worsening, 6/10 lower abdominal pain for 2 months. Exacerbated by eating. Alleviated by nothing. No home therapy tried. There is associated subjective fevers (not measured), decreased PO intake, nausea, and diarrhea. No emesis.      A full history, including pertinent past medical, family and social history was reviewed.    HISTORY:  There are no hospital problems to display for this patient.   Allergies   Allergen Reactions    Excedrin [Acetaminophen-Caffeine] Hives      Past Medical History:    HTN (hypertension)                                            Diverticulitis                                                Psoriasis                                                     Eczema                                                     Past Surgical History:    No surgical history                                           Social History     Marital Status: MARRIED             Spouse Name:                       Years of Education:  Number of children:               Occupational History    None on file    Social History Main Topics    Smoking Status: Former Smoker                   Packs/Day: 0.50  Years:           Types: Cigarettes      Quit date: 03/18/2014    Smokeless Status: Former Neurosurgeon                         Quit date: 03/21/2014    Alcohol Use: Not on file     Drug Use: No              Sexual Activity: Not on file          Other Topics            Concern    None on file    Social History Narrative    None on file     No family history on file.           Review of Systems   Constitutional: Positive for fever and appetite change.   Gastrointestinal: Positive for nausea, abdominal pain (lower), diarrhea (bloody) and blood in stool. Negative for vomiting.   All other systems reviewed and are negative.      TRIAGE VITAL SIGNS:  Temp: 36.7 C (98.1 F) (05/13/14 1142)  Temp src: Oral (05/13/14 1142)  Pulse: 80 (05/13/14 1142)  BP: 145/77 mmHg (05/13/14 1142)  Resp: 16 (05/13/14 1142)  SpO2: 98 % (05/13/14 1142)  Weight: 46.267 kg (102 lb) (05/13/14 1142)    Physical Exam   Constitutional: He is oriented to person, place, and time. He appears well-developed and well-nourished. No distress.   Well appearing NAD.   HENT:   Head: Normocephalic and atraumatic.   Right Ear: External ear normal.   Left Ear: External ear normal.   Nose: Nose normal.   Mouth/Throat: Oropharynx is clear and moist. No oropharyngeal exudate.   Eyes: EOM are normal. Pupils are equal, round, and reactive to light.   Neck: Normal range of motion. Neck supple.   Cardiovascular: Normal rate, regular rhythm and normal heart sounds.  Exam reveals no gallop and no friction rub.    No murmur heard.  Pulmonary/Chest: Effort normal and breath sounds normal. No respiratory distress. He has no wheezes. He has no rales.   Abdominal: Soft. He exhibits no distension. There is  tenderness (lower abdomen). There is guarding. There is no rebound.   Genitourinary:   Rectal exam: Erythema surrounding the anus. Hemorrhoids noted. No stool.   Musculoskeletal: Normal range of motion. He exhibits no edema.   No BLE edema.   Lymphadenopathy:     He has no cervical adenopathy.   Neurological: He is alert and oriented to person, place, and time.   Skin: Skin is warm and dry. He is not diaphoretic.   Psoriatic lesions over the back.  Diffuse psoriatic lesions over BLE.    Psychiatric: He has a normal mood and affect. His behavior is normal. Judgment and thought content normal.   Nursing note and vitals reviewed.        INITIAL ASSESSMENT & PLAN, MEDICAL DECISION MAKING, ED COURSE:  Lance Robertson is a 54yr old male who presents with a chief complaint of diarrhea, bloody. After history and exam,  I feel the diagnosis is most likely diverticulitis with abscess. Differential diagnosis includes, but is not limited to  colon cancer, brisk upper GI bleed, inflammatory bowel disease, hemorrhoids, colitis.  The patient is hemodynamically stable and will require monitoring as an immediate intervention. Initial treatment and studies to evaluate this problem will include serial exams, labs and imaging.         ED Course Update: The patient was observed in the ED. The results of the ED evaluation were notable for the following:    Pertinent lab results (reviewed and interpreted independently by me):   CBC: no leukocytosis, Hgb 11.9, hematocrit 36.9  BMP: hyperglycemic at 217    Pertinent imaging results  CT ABDOMEN + PELVIS WITH CONTRAST (per radiology)   Radiology read:  * Wall thickening and mild hyperemia of a segment of sigmoid colon with  associated adjacent mesenteric edema. This appears somewhat more pronounced  than on the prior study and likely represents recurrent diverticulitis. An  area of adjacent soft tissue density is similar in size compared to the  prior study, but appears somewhat more dense. This  could represent  abscess/phlegmon formation. However, this could also represent a mass. No  intra-abdominal free air to suggest bowel perforation.   * Nonspecific mesenteric haziness appears similar compared to the prior  study.  * Prostatomegaly with coarse prostatic calcifications.    Medications:  Ceftriaxone  Flagyl  IVF    Patient Summary: 54 yr old man presenting with worsening abdominal pain and bloody diarrhea. Well appearing and hemodynamically stable. Exam shows significant abdominal tenderness, no stool on rectal exam. Labs unremarkable without leukocytosis. CT abd shows likely recurrent vs worsened diverticulitis with abscess formation, possible underlying mass concerning for colon cancer. Given ceftriaxone, Flagyl, and IVF. ACS consulted for possible admission. Signed out to day team.        LAST VITAL SIGNS:  Temp: 36.5 C (97.7 F) (05/14/14 0342)  Temp src: Oral (05/14/14 0342)  Pulse: 68 (05/14/14 0342)  BP: (!) 149/100 mmHg (05/14/14 0342)  Resp: 16 (05/14/14 0342)  SpO2: 100 % (05/14/14 0342)  Weight: 46.267 kg (102 lb) (05/13/14 1142)      Clinical Impression:   Diverticulitis with abscess  Possible colonic mass      Anticipated work up needed: colonoscopy, ACS recs      Disposition: pending surgery recommendations       MDM COMPLEXITY  Overall Complexity of MDM is: high      PRESENT ON ADMISSION:  Are any of the following four conditions present or suspected on admission: decubitus ulcer, infection from an intravascular device, infection due to an indwelling catheter, surgical site infection or pneumonia? No.    PATIENT'S GENERAL CONDITION:  Fair: Vital signs are stable and within normal limits. Patient is conscious but may be uncomfortable. Indicators are favorable.      DISCLAIMER:   This document serves as my personal record of services taken in my presence. It was created on 05/14/2014 on my behalf by Glean Hess, a trained medical scribe.     I have reviewed this document and agree that  this note accurately reflects the history and exam findings, the patient care provided, and my medical decision making.    This patient was seen, evaluated, and the care plan was developed in conjunction with Ulla Potash, MD - Resident Physician. I agree with the findings and plan as outlined in our combined note.      05/14/2014  Electronically Signed By:  Shepard General, MD  Attending Physician, Department of Emergency Medicine  [facility]

## 2014-05-14 NOTE — ED Nursing Note (Signed)
1230 Reported back to Maria RN.

## 2014-05-14 NOTE — ED Nursing Note (Signed)
1125 Assumed care of pt from Jones BroomMaria Garcia RN for break relief coverage, pt resting well on gurney in hallway with no distress noted, no complaints made, family present with pt, rails up, will cont to monitor.

## 2014-05-14 NOTE — ED Nursing Note (Signed)
Pt. amb to gurney without issues - pt. With hx diverticulitis - pt. States several months X2 months of watery brown diarrhea with BRB noted in stool - pt. Recently seen at OSH for abd infection per reports and started on numerous abx in last several weeks - pt. States stools worse watery and increased lower abd pains - pt. Skin slight yellow tinged and warm and dry - pt. States 6/10 lower abd pains bilaterally and abd soft but TTP - pt. Denies issues urination - pt. With breath sounds clear bilaterally - pt. States lost a lot of weight in last several months and feels like everytime takes in PO it wants to BM out instantly - no acute distress noted [facility]

## 2014-05-14 NOTE — ED Nursing Note (Signed)
Pt. Denies N/V or SOB and endorses possible fevers and chills at home - no distress noted -

## 2014-05-14 NOTE — Case Management (ED) (Signed)
Name: Lance Robertson MRN: [id number]      Clinical Case Management Progress Note    Comments: Patient and wife was provided the phone number for [facility] to call to make an appointment for follow up and a list of clinics in the [place] area. MD on phone speaking to patient's daughter about patient's current condition    Further follow-up needed: none    Date: 05/14/2014  Time: 11:08    Electronically Signed by:  Celene Krasara Lux Skilton, RN,   Pager: [phone]

## 2014-05-14 NOTE — ED Nursing Note (Signed)
ACS team @ pt's bedside.

## 2014-05-14 NOTE — Discharge Planning (AHS/AVS) (Addendum)
Your PCP is Dr Rebekah ChesterfieldAnh Huynh On [phone], you have an appointment on May 15, 2014 at 10am.  [address]  [place], [place] [id number]    You Have [facility] [phone]. You have been provided a list of [place] clinics for your information

## 2014-05-21 ENCOUNTER — Ambulatory Visit: Payer: MEDICAID | Admitting: Surgery

## 2015-07-05 ENCOUNTER — Ambulatory Visit (INDEPENDENT_AMBULATORY_CARE_PROVIDER_SITE_OTHER): Payer: BLUE CROSS/BLUE SHIELD | Admitting: Family Medicine

## 2015-07-05 VITALS — BP 112/78 | HR 92 | Temp 99.8°F | Resp 20 | Ht 63.0 in | Wt 138.0 lb

## 2015-07-05 DIAGNOSIS — M10072 Idiopathic gout, left ankle and foot: Secondary | ICD-10-CM

## 2015-07-05 DIAGNOSIS — M25572 Pain in left ankle and joints of left foot: Secondary | ICD-10-CM | POA: Diagnosis not present

## 2015-07-05 LAB — COMPLETE METABOLIC PANEL WITH GFR
ALT: 18 U/L (ref 9–46)
AST: 22 U/L (ref 10–35)
Albumin: 4.2 g/dL (ref 3.6–5.1)
Alkaline Phosphatase: 91 U/L (ref 40–115)
BUN: 18 mg/dL (ref 7–25)
CO2: 26 mmol/L (ref 20–31)
Calcium: 9.5 mg/dL (ref 8.6–10.3)
Chloride: 102 mmol/L (ref 98–110)
Creat: 0.8 mg/dL (ref 0.70–1.33)
GFR, Est African American: >89 mL/min (ref >=60)
GFR, Est Non African American: >89 mL/min (ref >=60)
Glucose, Bld: 93 mg/dL (ref 65–99)
Potassium: 5 mmol/L (ref 3.5–5.3)
Sodium: 139 mmol/L (ref 135–146)
Total Bilirubin: 0.8 mg/dL (ref 0.2–1.2)
Total Protein: 7.7 g/dL (ref 6.1–8.1)

## 2015-07-05 LAB — URIC ACID: Uric Acid, Serum: 5.5 mg/dL (ref 4.0–7.8)

## 2015-07-05 MED ORDER — INDOMETHACIN 25 MG PO CAPS: 25.0000 mg | ORAL_CAPSULE | Freq: Three times a day (TID) | ORAL | Status: DC

## 2015-07-05 MED ORDER — PREDNISONE 20 MG PO TABS: ORAL_TABLET | ORAL | Status: DC

## 2015-07-05 NOTE — Progress Notes (Signed)
Patient ID: Cory Murphy, male    DOB: Jul 29, 1960  Age: 55 y.o. MRN: [id number]  Chief Complaint  Patient presents with  . Gout    left foot    Subjective:   55 year old man who is been having pain in his left foot since last Sunday. 3 years ago he had an acute flare of gout. He has not had any since then. He has had to be off of work this week. He works as a Psychologist, occupational. Otherwise he is healthy. He has taken some over-the-counter supplement medication but no prescription type meds for this. It has not helped.  Current allergies, medications, problem list, past/family and social histories reviewed.  Objective:  BP 112/78 mmHg  Pulse 92  Temp(Src) 99.8 F (37.7 C) (Oral)  Resp 20  Ht 5\' 3"  (1.6 m)  Wt 138 lb (62.596 kg)  BMI 24.45 kg/m2  SpO2 98%  No major acute distress. Obviously swollen and red left foot. The swelling is of the left ankle and foot. It he has erythema over the first MTP joint. It is very tender to touch there and in the ankle. Minimal pain in the left knee. The right side seems to be fine.  Assessment & Plan:   Assessment: 1. Acute idiopathic gout of left foot   2. Pain in joint, ankle and foot, left       Plan: Will treat. He had an x-ray 3 years ago which was normal. There is no history of trauma do not believe that x-ray as needed. If the uric acid level comes back high you may need to be placed on some allopurinol, but since he is only had rare intermittent attacks I think we can probably just treated episodically. Will give a work note through Monday, and he is to come back sooner if needed.  Orders Placed This Encounter  Procedures  . COMPLETE METABOLIC PANEL WITH GFR  . Uric acid    Meds ordered this encounter  Medications  . indomethacin (INDOCIN) 25 MG capsule    Sig: Take 1 capsule (25 mg total) by mouth 3 (three) times daily with meals.    Dispense:  40 capsule    Refill:  1  . predniSONE (DELTASONE) 20 MG tablet    Sig: Take 3 pills daily  for 2 days, then 2 daily for 2 days, then 1 daily for 2 days, then one half daily for 4 days    Dispense:  14 tablet    Refill:  0         Patient Instructions   Take the prednisone pills 3 pills daily for 2 days, then 2 daily for 2 days, then 1 daily for 2 days, then one half daily for the last 4 days.  Take the indomethacin 1 pill 3 times daily with food only when needed for the acute gout. When it is doing better you can discontinue them.  If not much better by  Tuesday please return. I suspect she will be able to go to work by then.  Gout Gout is an inflammatory arthritis caused by a buildup of uric acid crystals in the joints. Uric acid is a chemical that is normally present in the blood. When the level of uric acid in the blood is too high it can form crystals that deposit in your joints and tissues. This causes joint redness, soreness, and swelling (inflammation). Repeat attacks are common. Over time, uric acid crystals can form into masses (tophi) near a joint, destroying  bone and causing disfigurement. Gout is treatable and often preventable. CAUSES  The disease begins with elevated levels of uric acid in the blood. Uric acid is produced by your body when it breaks down a naturally found substance called purines. Certain foods you eat, such as meats and fish, contain high amounts of purines. Causes of an elevated uric acid level include:  Being passed down from parent to child (heredity).  Diseases that cause increased uric acid production (such as obesity, psoriasis, and certain cancers).  Excessive alcohol use.  Diet, especially diets rich in meat and seafood.  Medicines, including certain cancer-fighting medicines (chemotherapy), water pills (diuretics), and aspirin.  Chronic kidney disease. The kidneys are no longer able to remove uric acid well.  Problems with metabolism. Conditions strongly associated with gout include:  Obesity.  High blood pressure.  High  cholesterol.  Diabetes. Not everyone with elevated uric acid levels gets gout. It is not understood why some people get gout and others do not. Surgery, joint injury, and eating too much of certain foods are some of the factors that can lead to gout attacks. SYMPTOMS   An attack of gout comes on quickly. It causes intense pain with redness, swelling, and warmth in a joint.  Fever can occur.  Often, only one joint is involved. Certain joints are more commonly involved:  Base of the big toe.  Knee.  Ankle.  Wrist.  Finger. Without treatment, an attack usually goes away in a few days to weeks. Between attacks, you usually will not have symptoms, which is different from many other forms of arthritis. DIAGNOSIS  Your caregiver will suspect gout based on your symptoms and exam. In some cases, tests may be recommended. The tests may include:  Blood tests.  Urine tests.  X-rays.  Joint fluid exam. This exam requires a needle to remove fluid from the joint (arthrocentesis). Using a microscope, gout is confirmed when uric acid crystals are seen in the joint fluid. TREATMENT  There are two phases to gout treatment: treating the sudden onset (acute) attack and preventing attacks (prophylaxis).  Treatment of an Acute Attack.  Medicines are used. These include anti-inflammatory medicines or steroid medicines.  An injection of steroid medicine into the affected joint is sometimes necessary.  The painful joint is rested. Movement can worsen the arthritis.  You may use warm or cold treatments on painful joints, depending which works best for you.  Treatment to Prevent Attacks.  If you suffer from frequent gout attacks, your caregiver may advise preventive medicine. These medicines are started after the acute attack subsides. These medicines either help your kidneys eliminate uric acid from your body or decrease your uric acid production. You may need to stay on these medicines for a  very long time.  The early phase of treatment with preventive medicine can be associated with an increase in acute gout attacks. For this reason, during the first few months of treatment, your caregiver may also advise you to take medicines usually used for acute gout treatment. Be sure you understand your caregiver's directions. Your caregiver may make several adjustments to your medicine dose before these medicines are effective.  Discuss dietary treatment with your caregiver or dietitian. Alcohol and drinks high in sugar and fructose and foods such as meat, poultry, and seafood can increase uric acid levels. Your caregiver or dietitian can advise you on drinks and foods that should be limited. HOME CARE INSTRUCTIONS   Do not take aspirin to relieve pain. This  raises uric acid levels.  Only take over-the-counter or prescription medicines for pain, discomfort, or fever as directed by your caregiver.  Rest the joint as much as possible. When in bed, keep sheets and blankets off painful areas.  Keep the affected joint raised (elevated).  Apply warm or cold treatments to painful joints. Use of warm or cold treatments depends on which works best for you.  Use crutches if the painful joint is in your leg.  Drink enough fluids to keep your urine clear or pale yellow. This helps your body get rid of uric acid. Limit alcohol, sugary drinks, and fructose drinks.  Follow your dietary instructions. Pay careful attention to the amount of protein you eat. Your daily diet should emphasize fruits, vegetables, whole grains, and fat-free or low-fat milk products. Discuss the use of coffee, vitamin C, and cherries with your caregiver or dietitian. These may be helpful in lowering uric acid levels.  Maintain a healthy body weight. SEEK MEDICAL CARE IF:   You develop diarrhea, vomiting, or any side effects from medicines.  You do not feel better in 24 hours, or you are getting worse. SEEK IMMEDIATE MEDICAL  CARE IF:   Your joint becomes suddenly more tender, and you have chills or a fever. MAKE SURE YOU:   Understand these instructions.  Will watch your condition.  Will get help right away if you are not doing well or get worse.   This information is not intended to replace advice given to you by your health care provider. Make sure you discuss any questions you have with your health care provider.   Document Released: 04/02/2000 Document Revised: 04/26/2014 Document Reviewed: 11/17/2011 Elsevier Interactive Patient Education 2016 Else[employer] Inc.  B?nh gt (Gout) Gt l tnh tr?ng vim kh?p gy ra b?i s? tch t? tinh th? axit uric trong kh?p. Axit uric l m?t ch?t ha h?c th??ng hi?n di?n trong mu. Khi n?ng ?? axit uric trong mu qu cao, n c th? t?o [place] tin[place] th? tch t? trong kh?p v m c?a b?n. ?i?u ny gy ra t?y ??, ?au nh?c v s?ng kh?p (vim). Cc c?n gt l?p l?i l ph? bi?n. Theo th?i gian, cc tinh th? axit uric c th? t?o thnh cc c?c (h?t tophi) g?n kh?p, ph h?y x??ng v gy bi?n d?ng. Gt c th? ?i?u tr? ???c v th??ng c th? ng?n ng?a ???c.  NGUYN NHN C?n b?nh ny b?t ??u v?i n?ng ?? axit uric trong mu t?ng. Axit uric ???c t?o ra b?i c? th? khi ph v? m?t ch?t ???c tm th?y trong t? nhin c tn l purin. M?t s? lo?i th?c ph?m nh?t ??nh m b?n ?n, ch?ng h?n nh? th?t v c, c ch?a m?t l??ng l?n ch?t purin. Nguyn nhn lm cho n?ng ?? axit uric cao bao g?m:  Truy?n t? cha m? sang con (di truy?n).  B?nh lm t?ng s?n sinh axit uric (ch?ng h?n nh? bo ph, b?nh v?y n?n v m?t s? b?nh ung th? nh?t ??nh).  S? d?ng r??u qu m?c.  Ch? ?? ?n, ??c bi?t l ch? ?? ?n nhi?u th?t v h?i s?n.  Thu?c, bao g?m m?t s? lo?i thu?c ch?ng ung th? nh?t ??nh (ha tr? li?u), thu?c l?i ti?u v aspirin.  B?nh th?n m?n tnh. Th?n khng th? lo?i b? h?t axit uric ???c n?a.  V?n ?? v? chuy?n ha. Cc tnh tr?ng c lin quan ch?t ch? v?i b?nh gt bao g?m:  Bo ph.  Huy?t p  cao.  Cholesterol cao.  Ti?u ???ng. Khng ph?i t?t c? m?i ng??i c n?ng ?? axit uric cao ??u b? b?nh gt. Khng th? gi?i thch v sao m?t s? ng??i b? gt cn nh?ng ng??i khc l?i khng b?. Ph?u thu?t, ch?n th??ng kh?p v ?n qu nhi?u m?t s? lo?i th?c ph?m nh?t ??nh l m?t s? y?u t? c th? d?n ??n c?n gt c?p. TRI?U CH?NG  C?n gt c?p xu?t hi?n nhanh chng. B?nh gy ?au ??n d? d?i km theo t?y ??, s?ng v ?m ? m?t kh?p.  C th? b? s?t.  Thng th??ng, ch? l m?t kh?p b? ?au. M?t s? kh?p nh?t ??nh th??ng b? ?au:  N?n ngn chn ci.  ??u g?i.  C? chn.  C? tay.  Ngn tay. N?u khng ?i?u tr?, c?n gt c?p th??ng bi?n m?t trong m?t vi ngy ??n vi tu?n. Gi?a cc c?n gt c?p, b?n th??ng s? khng c cc tri?u ch?ng, cc tri?u ch?ng th??ng khc v?i cc d?ng vim kh?p khc. CH?N ?ON Chuyn gia ch?m [place] s?c kh?e s? nghi ng? b?nh gt d?a trn cc tri?u ch?ng v vi?c khm. Trong m?t s? tr??ng h?p, b?n c th? c?n xt nghi?m. Cc xt nghi?m c th? bao g?m:  Xt nghi?m mu.  Xt nghi?m n??c ti?u.  Ch?p X quang.  Xt nghi?m d?ch kh?p. Xt nghi?m ny c?n m?t cy kim ?? ht d?ch ra kh?i kh?p (ch?c kh?p). B?ng cch s? d?ng knh hi?n vi, b?nh gt ???c xc nh?n khi tinh th? axit uric ???c pht hi?n trong d?ch kh?p. ?I?U TR? C hai giai ?o?n ?i?u tr? b?nh gt: ?i?u tr? c?n gt c?p kh?i pht ??t ng?t (c?p tnh) v ng?n ch?n c?n gt c?p (d? phng).  ?i?u Tr? C?n Gt C?p.  Thu?c ???c s? d?ng. Cc lo?i thu?c ny bao g?m thu?c ch?ng vim ho?c thu?c steroid.  ?i khi c?n tim thu?c steroid vo kh?p b? ?au.  Kh?p ?au ???c ngh? ng?i. V?n ??ng c th? lm tr?m tr?ng thm b?nh vim kh?p.  B?n c th? s? d?ng ph??ng php ?i?u tr? ?m ho?c l?nh trn kh?p b? ?au, ty thu?c ph??ng php no ph h?p nh?t v?i b?n.  ?i?u Tr? ?? Ng?n Ch?n Cc C?n Gt C?p.  N?u b?n th??ng xuyn b? cc c?n gt c?p, chuyn gia ch?m [place] s?c kh?e c th? t? v?n cho b?n s? d?ng thu?c d? phng. Nh?ng thu?c ny ???c b?t ??u sau khi c?n  gt c?p ? ??. Nh?ng lo?i thu?c ny c gip th?n lo?i b? axit uric ra kh?i c? th? c?a b?n ho?c gi?m s?n xu?t axit uric. B?n c th? c?n ti?p t?c s? d?ng cc lo?i thu?c ny trong m?t th?i gian r?t di.  Giai ?o?n ?i?u tr? ban ??u b?ng thu?c phng ng?a c th? k?t h?p v?i s? gia t?ng cc c?n gt c?p. V l do ny, trong nh?ng thng ??u ?i?u tr?, chuyn gia ch?m [place] s?c kh?e c?ng c th? t? v?n cho b?n dng cc lo?i thu?c th??ng ???c s? d?ng ?? ?i?u tr? b?nh gt c?p tnh. ??m b?o b?n hi?u h??ng d?n c?a chuyn gia ch?m [place] s?c kh?e. Chuyn gia ch?m [place] s?c kh?e c th? th?c hi?n m?t s? ?i?u ch?nh li?u thu?c c?a b?n tr??c khi nh?ng thu?c ny c hi?u qu?[place]land Kitchen  Th?o lu?n v? ?i?u tr? b?ng ch? ?? ?n u?ng v?i chuyn gia ch?m [place] s?c kh?e ho?c chuyn gia dinh d??ng c?a b?n. R??u v ?? u?ng ch?a nhi?u ???ng v  fructoza v cc th?c ph?m nh? th?t, gia c?m v h?i s?n c th? lm t?ng n?ng ?? axit uric. Chuyn gia ch?m [place] s?c kh?e ho?c chuyn gia dinh d??ng c?a b?n c th? t? v?n cho b?n v? ?? u?ng v th?c ph?m no c?n ph?i h?n ch?. H??NG D?N CH?M [place] T?I NH  Khng s? d?ng aspirin ?? gi?m ?au. Aspirin lm t?ng n?ng ?? axit uric.  Ch? s? d?ng thu?c khng c?n k toa ho?c thu?c c?n k toa ?? gi?m ?au, gi?m c?m gic kh ch?u ho?c h? s?t theo ch? d?n c?a chuyn gia ch?m [place] s?c kh?e c?a b?n.  ?? kh?p ? ngh? ng?i cng nhi?u cng t?t. Khi ? trn gi??ng, gi? cho kh?n tr?i gi??ng v ch?n trnh xa nh?ng ch? b? ?au.  Gi? cho kh?p b? ?au ? trn cao (gi? cao).  Ch??m ?m ho?c l?nh vo kh?p b? ?au. S? d?ng ph??ng php ?i?u tr? ?m ho?c l?nh ty thu?c vo ph??ng php no ph h?p nh?t v?i b?n.  S? d?ng n?ng n?u kh?p b? ?au ? chn b?n.  U?ng ?? n??c ?? gi? cho n??c ti?u trong ho?c vng nh?t. ?i?u ny gip c? th? b?n lo?i b? axit uric. H?n ch? r??u, ?? u?ng c ???ng v ?? u?ng c fructoza.  Th?c hi?n theo h??ng d?n v? ch? ?? ?n u?ng. Hy ch  c?n th?n ??n l??ng prtein b?n ?n. Ch? ?? ?n u?ng hng ngy c?a b?n nn t?p [place] vo tri cy,  rau, ng? c?c nguyn h?t v nh?ng s?n ph?m s?a khng c ch?t bo ho?c t ch?t bo. Th?o lu?n v? vi?c s? d?ng c ph, vitamin C v anh ?o v?i chuyn gia ch?m [place] s?c kh?e v chuyn gia dinh d??ng c?a b?n. Chng c th? gip lm gi?m n?ng ?? axit uric.  Duy tr tr?ng l??ng c? th? kh?e m?nh. HY ?I KHM N?U:  B?n b? tiu ch?y, nn m?a ho?c b?t k? tc d?ng ph? no t? thu?c.  B?n khng c?m th?y kh h?n sau 24 gi?, ho?c b?n th?y t? h?n. HY NGAY L?P T?C ?I KHM N?U:  Kh?p c?a b?n ??t nhin tr? nn nh?y c?m ?au h?n v b?n b? ?n l?nh ho?c s?t. ??M B?O B?N:  Hi?u cc h??ng d?n ny.  S? theo di tnh tr?ng c?a mnh.  S? yu c?u tr? gip ngay l?p t?c n?u b?n c?m th?y khng ?? ho?c tnh tr?ng tr?m tr?ng h?n.   Thng tin ny khng nh?m m?c ?ch thay th? cho l?i khuyn m chuyn gia ch?m [place] s?c kh?e ni v?i qu v?. Hy b?o ??m qu v? ph?i th?o lu?n b?t k? v?n ?? g m qu v? c v?i chuyn gia ch?m [place] s?c kh?e c?a qu v?.   Document Released: 01/13/2005 Document Revised: 12/06/2012 Elsevier Interactive Patient Education [employer]    IF you received an x-ray today, you will receive an invoice from [facility]. Please contact [facility] at [phone] with questions or concerns regarding your invoice.   IF you received labwork today, you will receive an invoice from [employer]. Please contact [facility] at [phone] with questions or concerns regarding your invoice.   Our billing staff will not be able to assist you with questions regarding bills from these companies.  You will be contacted with the lab results as soon as they are available. The fastest way to get your results is to activate your My Chart account. Instructions are located on the last  page of this paperwork. If you have not heard from [place] regarding the results in 2 weeks, please contact this office.          Return if symptoms worsen or fail to  improve.   Stephnie Parlier, MD 07/05/2015

## 2015-07-05 NOTE — Patient Instructions (Addendum)
Take the prednisone pills 3 pills daily for 2 days, then 2 daily for 2 days, then 1 daily for 2 days, then one half daily for the last 4 days.  Take the indomethacin 1 pill 3 times daily with food only when needed for the acute gout. When it is doing better you can discontinue them.  If not much better by  Tuesday please return. I suspect she will be able to go to work by then.  Gout Gout is an inflammatory arthritis caused by a buildup of uric acid crystals in the joints. Uric acid is a chemical that is normally present in the blood. When the level of uric acid in the blood is too high it can form crystals that deposit in your joints and tissues. This causes joint redness, soreness, and swelling (inflammation). Repeat attacks are common. Over time, uric acid crystals can form into masses (tophi) near a joint, destroying bone and causing disfigurement. Gout is treatable and often preventable. CAUSES  The disease begins with elevated levels of uric acid in the blood. Uric acid is produced by your body when it breaks down a naturally found substance called purines. Certain foods you eat, such as meats and fish, contain high amounts of purines. Causes of an elevated uric acid level include:  Being passed down from parent to child (heredity).  Diseases that cause increased uric acid production (such as obesity, psoriasis, and certain cancers).  Excessive alcohol use.  Diet, especially diets rich in meat and seafood.  Medicines, including certain cancer-fighting medicines (chemotherapy), water pills (diuretics), and aspirin.  Chronic kidney disease. The kidneys are no longer able to remove uric acid well.  Problems with metabolism. Conditions strongly associated with gout include:  Obesity.  High blood pressure.  High cholesterol.  Diabetes. Not everyone with elevated uric acid levels gets gout. It is not understood why some people get gout and others do not. Surgery, joint injury, and  eating too much of certain foods are some of the factors that can lead to gout attacks. SYMPTOMS   An attack of gout comes on quickly. It causes intense pain with redness, swelling, and warmth in a joint.  Fever can occur.  Often, only one joint is involved. Certain joints are more commonly involved:  Base of the big toe.  Knee.  Ankle.  Wrist.  Finger. Without treatment, an attack usually goes away in a few days to weeks. Between attacks, you usually will not have symptoms, which is different from many other forms of arthritis. DIAGNOSIS  Your caregiver will suspect gout based on your symptoms and exam. In some cases, tests may be recommended. The tests may include:  Blood tests.  Urine tests.  X-rays.  Joint fluid exam. This exam requires a needle to remove fluid from the joint (arthrocentesis). Using a microscope, gout is confirmed when uric acid crystals are seen in the joint fluid. TREATMENT  There are two phases to gout treatment: treating the sudden onset (acute) attack and preventing attacks (prophylaxis).  Treatment of an Acute Attack.  Medicines are used. These include anti-inflammatory medicines or steroid medicines.  An injection of steroid medicine into the affected joint is sometimes necessary.  The painful joint is rested. Movement can worsen the arthritis.  You may use warm or cold treatments on painful joints, depending which works best for you.  Treatment to Prevent Attacks.  If you suffer from frequent gout attacks, your caregiver may advise preventive medicine. These medicines are started after the  acute attack subsides. These medicines either help your kidneys eliminate uric acid from your body or decrease your uric acid production. You may need to stay on these medicines for a very long time.  The early phase of treatment with preventive medicine can be associated with an increase in acute gout attacks. For this reason, during the first few months  of treatment, your caregiver may also advise you to take medicines usually used for acute gout treatment. Be sure you understand your caregiver's directions. Your caregiver may make several adjustments to your medicine dose before these medicines are effective.  Discuss dietary treatment with your caregiver or dietitian. Alcohol and drinks high in sugar and fructose and foods such as meat, poultry, and seafood can increase uric acid levels. Your caregiver or dietitian can advise you on drinks and foods that should be limited. HOME CARE INSTRUCTIONS   Do not take aspirin to relieve pain. This raises uric acid levels.  Only take over-the-counter or prescription medicines for pain, discomfort, or fever as directed by your caregiver.  Rest the joint as much as possible. When in bed, keep sheets and blankets off painful areas.  Keep the affected joint raised (elevated).  Apply warm or cold treatments to painful joints. Use of warm or cold treatments depends on which works best for you.  Use crutches if the painful joint is in your leg.  Drink enough fluids to keep your urine clear or pale yellow. This helps your body get rid of uric acid. Limit alcohol, sugary drinks, and fructose drinks.  Follow your dietary instructions. Pay careful attention to the amount of protein you eat. Your daily diet should emphasize fruits, vegetables, whole grains, and fat-free or low-fat milk products. Discuss the use of coffee, vitamin C, and cherries with your caregiver or dietitian. These may be helpful in lowering uric acid levels.  Maintain a healthy body weight. SEEK MEDICAL CARE IF:   You develop diarrhea, vomiting, or any side effects from medicines.  You do not feel better in 24 hours, or you are getting worse. SEEK IMMEDIATE MEDICAL CARE IF:   Your joint becomes suddenly more tender, and you have chills or a fever. MAKE SURE YOU:   Understand these instructions.  Will watch your condition.  Will  get help right away if you are not doing well or get worse.   This information is not intended to replace advice given to you by your health care provider. Make sure you discuss any questions you have with your health care provider.   Document Released: 04/02/2000 Document Revised: 04/26/2014 Document Reviewed: 11/17/2011 Elsevier Interactive Patient Education 2016 [employer]  B?nh gt (Gout) Gt l tnh tr?ng vim kh?p gy ra b?i s? tch t? tinh th? axit uric trong kh?p. Axit uric l m?t ch?t ha h?c th??ng hi?n di?n trong mu. Khi n?ng ?? axit uric trong mu qu cao, n c th? t?o [place] th? tch t? trong kh?p v m c?a b?n. ?i?u ny gy ra t?y ??, ?au nh?c v s?ng kh?p (vim). Cc c?n gt l?p l?i l ph? bi?n. Theo th?i gian, cc tinh th? axit uric c th? t?o thnh cc c?c (h?t tophi) g?n kh?p, ph h?y x??ng v gy bi?n d?ng. Gt c th? ?i?u tr? ???c v th??ng c th? ng?n ng?a ???c.  NGUYN NHN C?n b?nh ny b?t ??u v?i n?ng ?? axit uric trong mu t?ng. Axit uric ???c t?o ra b?i c? th? khi ph v? m?t ch?t ???c tm  th?y trong t? nhin c tn l purin. M?t s? lo?i th?c ph?m nh?t ??nh m b?n ?n, ch?ng h?n nh? th?t v c, c ch?a m?t l??ng l?n ch?t purin. Nguyn nhn lm cho n?ng ?? axit uric cao bao g?m:  Truy?n t? cha m? sang con (di truy?n).  B?nh lm t?ng s?n sinh axit uric (ch?ng h?n nh? bo ph, b?nh v?y n?n v m?t s? b?nh ung th? nh?t ??nh).  S? d?ng r??u qu m?c.  Ch? ?? ?n, ??c bi?t l ch? ?? ?n nhi?u th?t v h?i s?n.  Thu?c, bao g?m m?t s? lo?i thu?c ch?ng ung th? nh?t ??nh (ha tr? li?u), thu?c l?i ti?u v aspirin.  B?nh th?n m?n tnh. Th?n khng th? lo?i b? h?t axit uric ???c n?a.  V?n ?? v? chuy?n ha. Cc tnh tr?ng c lin quan ch?t ch? v?i b?nh gt bao g?m:  Bo ph.  Huy?t p cao.  Cholesterol cao.  Ti?u ???ng. Khng ph?i t?t c? m?i ng??i c n?ng ?? axit uric cao ??u b? b?nh gt. Khng th? gi?i thch v sao m?t s? ng??i b? gt cn nh?ng ng??i khc l?i khng  b?. Ph?u thu?t, ch?n th??ng kh?p v ?n qu nhi?u m?t s? lo?i th?c ph?m nh?t ??nh l m?t s? y?u t? c th? d?n ??n c?n gt c?p. TRI?U CH?NG  C?n gt c?p xu?t hi?n nhanh chng. B?nh gy ?au ??n d? d?i km theo t?y ??, s?ng v ?m ? m?t kh?p.  C th? b? s?t.  Thng th??ng, ch? l m?t kh?p b? ?au. M?t s? kh?p nh?t ??nh th??ng b? ?au:  N?n ngn chn ci.  ??u g?i.  C? chn.  C? tay.  Ngn tay. N?u khng ?i?u tr?, c?n gt c?p th??ng bi?n m?t trong m?t vi ngy ??n vi tu?n. Gi?a cc c?n gt c?p, b?n th??ng s? khng c cc tri?u ch?ng, cc tri?u ch?ng th??ng khc v?i cc d?ng vim kh?p khc. CH?N ?ON Chuyn gia ch?m [place] s?c kh?e s? nghi ng? b?nh gt d?a trn cc tri?u ch?ng v vi?c khm. Trong m?t s? tr??ng h?p, b?n c th? c?n xt nghi?m. Cc xt nghi?m c th? bao g?m:  Xt nghi?m mu.  Xt nghi?m n??c ti?u.  Ch?p X quang.  Xt nghi?m d?ch kh?p. Xt nghi?m ny c?n m?t cy kim ?? ht d?ch ra kh?i kh?p (ch?c kh?p). B?ng cch s? d?ng knh hi?n vi, b?nh gt ???c xc nh?n khi tinh th? axit uric ???c pht hi?n trong d?ch kh?p. ?I?U TR? C hai giai ?o?n ?i?u tr? b?nh gt: ?i?u tr? c?n gt c?p kh?i pht ??t ng?t (c?p tnh) v ng?n ch?n c?n gt c?p (d? phng).  ?i?u Tr? C?n Gt C?p.  Thu?c ???c s? d?ng. Cc lo?i thu?c ny bao g?m thu?c ch?ng vim ho?c thu?c steroid.  ?i khi c?n tim thu?c steroid vo kh?p b? ?au.  Kh?p ?au ???c ngh? ng?i. V?n ??ng c th? lm tr?m tr?ng thm b?nh vim kh?p.  B?n c th? s? d?ng ph??ng php ?i?u tr? ?m ho?c l?nh trn kh?p b? ?au, ty thu?c ph??ng php no ph h?p nh?t v?i b?n.  ?i?u Tr? ?? Ng?n Ch?n Cc C?n Gt C?p.  N?u b?n th??ng xuyn b? cc c?n gt c?p, chuyn gia ch?m [place] s?c kh?e c th? t? v?n cho b?n s? d?ng thu?c d? phng. Nh?ng thu?c ny ???c b?t ??u sau khi c?n gt c?p ? ??. Nh?ng lo?i thu?c ny c gip th?n lo?i b? axit uric ra kh?i c? th?  c?a b?n ho?c gi?m s?n xu?t axit uric. B?n c th? c?n ti?p t?c s? d?ng cc lo?i thu?c ny trong m?t th?i gian  r?t di.  Giai ?o?n ?i?u tr? ban ??u b?ng thu?c phng ng?a c th? k?t h?p v?i s? gia t?ng cc c?n gt c?p. V l do ny, trong nh?ng thng ??u ?i?u tr?, chuyn gia ch?m [place] s?c kh?e c?ng c th? t? v?n cho b?n dng cc lo?i thu?c th??ng ???c s? d?ng ?? ?i?u tr? b?nh gt c?p tnh. ??m b?o b?n hi?u h??ng d?n c?a chuyn gia ch?m [place] s?c kh?e. Chuyn gia ch?m [place] s?c kh?e c th? th?c hi?n m?t s? ?i?u ch?nh li?u thu?c c?a b?n tr??c khi nh?ng thu?c ny c hi?u qu?[place]land Kitchen  Th?o lu?n v? ?i?u tr? b?ng ch? ?? ?n u?ng v?i chuyn gia ch?m [place] s?c kh?e ho?c chuyn gia dinh d??ng c?a b?n. R??u v ?? u?ng ch?a nhi?u ???ng v fructoza v cc th?c ph?m nh? th?t, gia c?m v h?i s?n c th? lm t?ng n?ng ?? axit uric. Chuyn gia ch?m [place] s?c kh?e ho?c chuyn gia dinh d??ng c?a b?n c th? t? v?n cho b?n v? ?? u?ng v th?c ph?m no c?n ph?i h?n ch?. H??NG D?N CH?M [place] T?I NH  Khng s? d?ng aspirin ?? gi?m ?au. Aspirin lm t?ng n?ng ?? axit uric.  Ch? s? d?ng thu?c khng c?n k toa ho?c thu?c c?n k toa ?? gi?m ?au, gi?m c?m gic kh ch?u ho?c h? s?t theo ch? d?n c?a chuyn gia ch?m [place] s?c kh?e c?a b?n.  ?? kh?p ? ngh? ng?i cng nhi?u cng t?t. Khi ? trn gi??ng, gi? cho kh?n tr?i gi??ng v ch?n trnh xa nh?ng ch? b? ?au.  Gi? cho kh?p b? ?au ? trn cao (gi? cao).  Ch??m ?m ho?c l?nh vo kh?p b? ?au. S? d?ng ph??ng php ?i?u tr? ?m ho?c l?nh ty thu?c vo ph??ng php no ph h?p nh?t v?i b?n.  S? d?ng n?ng n?u kh?p b? ?au ? chn b?n.  U?ng ?? n??c ?? gi? cho n??c ti?u trong ho?c vng nh?t. ?i?u ny gip c? th? b?n lo?i b? axit uric. H?n ch? r??u, ?? u?ng c ???ng v ?? u?ng c fructoza.  Th?c hi?n theo h??ng d?n v? ch? ?? ?n u?ng. Hy ch  c?n th?n ??n l??ng prtein b?n ?n. Ch? ?? ?n u?ng hng ngy c?a b?n nn t?p [place] vo tri cy, rau, ng? c?c nguyn h?t v nh?ng s?n ph?m s?a khng c ch?t bo ho?c t ch?t bo. Th?o lu?n v? vi?c s? d?ng c ph, vitamin C v anh ?o v?i chuyn gia ch?m [place] s?c kh?e v chuyn gia dinh  d??ng c?a b?n. Chng c th? gip lm gi?m n?ng ?? axit uric.  Duy tr tr?ng l??ng c? th? kh?e m?nh. HY ?I KHM N?U:  B?n b? tiu ch?y, nn m?a ho?c b?t k? tc d?ng ph? no t? thu?c.  B?n khng c?m th?y kh h?n sau 24 gi?, ho?c b?n th?y t? h?n. HY NGAY L?P T?C ?I KHM N?U:  Kh?p c?a b?n ??t nhin tr? nn nh?y c?m ?au h?n v b?n b? ?n l?nh ho?c s?t. ??M B?O B?N:  Hi?u cc h??ng d?n ny.  S? theo di tnh tr?ng c?a mnh.  S? yu c?u tr? gip ngay l?p t?c n?u b?n c?m th?y khng ?? ho?c tnh tr?ng tr?m tr?ng h?n.   Thng tin ny khng nh?m m?c ?ch thay th? cho l?i khuyn m chuyn gia ch?m  s?c  kh?e ni v?i qu v?. Hy b?o ??m qu v? ph?i th?o lu?n b?t k? v?n ?? g m qu v? c v?i chuyn gia ch?m  s?c kh?e c?a qu v?.   Document Released: 01/13/2005 Document Revised: 12/06/2012 Elsevier Interactive Patient Education [employer]    IF you received an x-ray today, you will receive an invoice from [facility]. Please contact [facility] at [phone] with questions or concerns regarding your invoice.   IF you received labwork today, you will receive an invoice from [employer]. Please contact [facility] at [phone] with questions or concerns regarding your invoice.   Our billing staff will not be able to assist you with questions regarding bills from these companies.  You will be contacted with the lab results as soon as they are available. The fastest way to get your results is to activate your My Chart account. Instructions are located on the last page of this paperwork. If you have not heard from [place] regarding the results in 2 weeks, please contact this office.

## 2016-01-29 DIAGNOSIS — Z23 Encounter for immunization: Secondary | ICD-10-CM | POA: Diagnosis not present

## 2017-01-04 ENCOUNTER — Ambulatory Visit (INDEPENDENT_AMBULATORY_CARE_PROVIDER_SITE_OTHER): Payer: BLUE CROSS/BLUE SHIELD | Admitting: Physician Assistant

## 2017-01-04 ENCOUNTER — Encounter: Payer: Self-pay | Admitting: Physician Assistant

## 2017-01-04 VITALS — BP 148/90 | HR 71 | Temp 99.2°F | Resp 16 | Ht 63.0 in | Wt 140.4 lb

## 2017-01-04 DIAGNOSIS — Z114 Encounter for screening for human immunodeficiency virus [HIV]: Secondary | ICD-10-CM | POA: Diagnosis not present

## 2017-01-04 DIAGNOSIS — Z130 Encounter for screening for diseases of the blood and blood-forming organs and certain disorders involving the immune mechanism: Secondary | ICD-10-CM

## 2017-01-04 DIAGNOSIS — Z23 Encounter for immunization: Secondary | ICD-10-CM

## 2017-01-04 DIAGNOSIS — Z13220 Encounter for screening for lipoid disorders: Secondary | ICD-10-CM | POA: Diagnosis not present

## 2017-01-04 DIAGNOSIS — Z0000 Encounter for general adult medical examination without abnormal findings: Secondary | ICD-10-CM | POA: Diagnosis not present

## 2017-01-04 DIAGNOSIS — Z9189 Other specified personal risk factors, not elsewhere classified: Secondary | ICD-10-CM | POA: Diagnosis not present

## 2017-01-04 DIAGNOSIS — Z87891 Personal history of nicotine dependence: Secondary | ICD-10-CM | POA: Diagnosis not present

## 2017-01-04 DIAGNOSIS — Z1159 Encounter for screening for other viral diseases: Secondary | ICD-10-CM | POA: Diagnosis not present

## 2017-01-04 DIAGNOSIS — M546 Pain in thoracic spine: Secondary | ICD-10-CM | POA: Diagnosis not present

## 2017-01-04 DIAGNOSIS — Z1211 Encounter for screening for malignant neoplasm of colon: Secondary | ICD-10-CM

## 2017-01-04 DIAGNOSIS — Z131 Encounter for screening for diabetes mellitus: Secondary | ICD-10-CM

## 2017-01-04 LAB — POCT URINALYSIS DIP (MANUAL ENTRY)
Bilirubin, UA: NEGATIVE
Blood, UA: NEGATIVE
Glucose, UA: NEGATIVE mg/dL
Ketones, POC UA: NEGATIVE mg/dL
Leukocytes, UA: NEGATIVE
Nitrite, UA: NEGATIVE
Protein Ur, POC: NEGATIVE mg/dL
Spec Grav, UA: 1.015 (ref 1.010–1.025)
Urobilinogen, UA: 1 E.U./dL
pH, UA: 6 (ref 5.0–8.0)

## 2017-01-04 MED ORDER — IBUPROFEN 600 MG PO TABS: 600.0000 mg | ORAL_TABLET | Freq: Three times a day (TID) | ORAL | 0 refills | Status: DC | PRN

## 2017-01-04 MED ORDER — CYCLOBENZAPRINE HCL 10 MG PO TABS: 10.0000 mg | ORAL_TABLET | Freq: Three times a day (TID) | ORAL | 0 refills | Status: DC | PRN

## 2017-01-04 NOTE — Patient Instructions (Addendum)
Make an appointment to see a dentist. You should see a dentist two times a year.  Start flossing your teeth once daily.  We will send you a letter with the results of today's labs.   Thank you for coming in today. I hope you feel we met your needs.  Feel free to call PCP if you have any questions or further requests.  Please consider signing up for MyChart if you do not already have it, as this is a great way to communicate with me.  Best,  Whitney McVey, PA-C  N?i soi ??i trng, Ng??i l?n Colonoscopy, Adult N?i soi ??i trng l th?m khm ?? ki?m tra ton b? ru?t gi. Trong qu trnh th?m khm, m?t ?ng ???c bi tr?n, c th? u?n cong ???c ??a vo trong h?u mn v sau ? vo tr?c trng, ??i trng v cc ph?n khc c?a ru?t gi. N?i soi ??i trng th??ng ???c th?c hi?n nh? m?t ph?n c?a khm sng l?c ??i-tr?c k?t trng thng th??ng ho?c ?? ?ng ph v?i m?t s? tri?u ch?ng nh?t ??nh, ch?ng h?n nh? b?nh thi?u mu, tiu ch?y dai d?ng, ?au b?ng v mu trong phn. Th?m khm ny c th? gip sng l?c v ch?n ?on cc v?n ?? b?nh l, bao g?m:  Kh?i u.  Polip.  Vim.  Cc vng ch?y mu.  Hy cho chuyn gia ch?m [place] s?c kh?e bi?t v?:  B?t k? v?n ?? d? ?ng no m qu v? c.  T?t c? cc lo?i thu?c m qu v? ?ang s? d?ng, bao g?m c? vitamin, th?o d??c, thu?c nh? m?t, thu?c d?ng kem, thu?c khng k ??n.  B?t k? v?n ?? g m qu v? ho?c cc thnh vin trong gia ?nh ? g?p ph?i v?i thu?c gy m.  B?t k? r?i lo?n v? mu no m qu v? c.  B?t k? ph?u thu?t no qu v? ? ???c lm.  B?t k? tnh tr?ng b?nh l no c?a qu v?.  B?t k? v?n ?? no m qu v? ? b? trong khi ??i ti?n. Cc nguy c? l g? Ni chung, ?y l m?t th? thu?t an ton. Tuy nhin, cc v?n ?? c th? x?y ra, bao g?m:  Ch?y mu.  V?t rch trong ru?t.  Ph?n ?ng v?i thu?c ???c cho s? d?ng trong lc th?m khm.  Nhi?m trng (hi?m g?p).  ?i?u g x?y ra tr??c khi lm th? thu?t? Nh?ng h?n ch? v? ?n v u?ng [place] th? ch? d?n c?a chuyn  gia ch?m [place] s?c kh?e v? ?n v u?ng, c th? bao g?m:  M?t vi ngy tr??c khi ti?n hnh th? thu?t - tun theo ch? ?? ?n t ch?t x?. Trnh ?n qu? h?ch, cc lo?i h?t, tri cy s?y, tri cy s?ng v rau.  1-3 ngy tr??c ngy ti?n hnh th? thu?t - tun theo ch? ?? ?n ?? l?ng trong. Ch? u?ng cc ?? l?ng trong, ch?ng h?n nh? canh ho?c n??c canh th?t trong, tr ho?c c ph ?en, n??c p trong, n??c ng?t ho?c n??c u?ng th? thao trong, mn trng mi?ng ch?a gelatin v kem que. Trnh u?ng cc ch?t l?ng c ch?a ph?m mu ?? ho?c tm.  Vo ngy ti?n hnh th? thu?t - khng ?n hay u?ng b?t k? th? g trong vng 2 gi? tr??c khi ti?n hnh th? thu?t, ho?c trong kho?ng th?i gian m chuyn gia ch?m [place] s?c kh?e c?a qu v? ch? d?n.  Lm s?ch ru?t N?u qu v? ? ???c k ??n m?t  lo?i thu?c x? qua ???ng u?ng ?? lm s?ch ??i trng:  Hy s?? du?ng theo ch? d?n c?a chuyn gia ch?m [place] s?c kh?e c?a qu v?. B?t ??u vo ngy tr??c khi lm th? thu?t, qu v? s? c?n u?ng m?t l??ng l?n d?ch l?ng pha thu?c. Ch?t l?ng ny s? lm qu v? ??i ti?n phn l?ng nhi?u l?n cho ??n khi phn g?n nh? trong ho?c c mu xanh l cy nh?t.  N?u da ho?c h?u mn c?a qu v? b? kch ?ng do tiu ch?y, qu v? c th? s? d?ng nh?ng th? sau ?? lm gi?m kch ?ng: ? Kh?n lau t?m thu?c, ch?ng h?n nh? kh?n lau ??t c?a ng??i l?n c tinh ch?t l h?i v vitamin E. ? S?n ph?m lm d?u da nh? vaseline.  N?u qu v? b? nn trong khi u?ng thu?c x?, hy ngh? ng?i trong t?i ?a 60 pht v sau ? b?t ??u l?i vi?c lm s?ch ru?t. N?u qu v? ti?p t?c nn v khng th? u?ng thu?c x? m khng b? nn, hy g?i cho chuyn gia ch?m [place] s?c kh?e c?a qu v?.  H??ng d?n chung  Hy h?i chuyn gia ch?m [place] s?c kh?e v? vi?c thay ??i ho?c d?ng cc lo?i thu?c dng th??ng xuyn c?a qu v?. ?i?u ny ??c bi?t quan tr?ng n?u qu v? ?ang dng thu?c tr? ti?u ???ng ho?c thu?c lm long mu.  C k? ho?ch nh? ai ? ??a quy? vi? t? b?nh vi?n ho?c t? phng khm v? nh. ?i?u g x?y ra trong qu trnh  th?c hi?n th? thu?t?  Qu v? c th? ???c ??t m?t ???ng truy?n t?nh m?ch (IV) vo m?t trong cc t?nh m?ch.  Qu v? s? ???c cho dng thu?c ?? gip th? gin (thu?c an th?n).  ?? gi?m nguy c? nhi?m trng: ? ??i ng? nhn vin y t? s? r?a ho?c st trng tay c?a h?. ? Vng h?u mn c?a qu v? s? ???c r?a b?ng x phng.  Qu v? s? ???c yu c?u n?m nghing, hai ??u g?i g?p l?i.  Chuyn gia ch?m [place] s?c kh?e c?a qu v? s? bi tr?n m?t ?ng di, m?ng, m?m. ?ng s? ???c g?n camera v ?n ? ??u.  ?ng s? ???c ??a vo h?u mn c?a qu v?.  ?ng s? ???c nh? nhng ??a qua tr?c trng v ??i trng c?a qu v?.  Khng kh s? ???c b?m vo ??i trng c?a qu v? ?? gi? cho ??i trng m? r?ng. Qu v? c th? c?m th?y m?t cht p l?c ho?c co th?t.  Camera s? ???c s? d?ng ?? ch?p ?nh trong qu trnh ti?n hnh th? thu?t.  M?t m?u m nh? co? th? ????c l?y t?? c? th? c?a qu v? ?? ki?m tra d???i ki?nh hi?n vi (sinh thi?t). N?u pht hi?n th?y b?t k? v?n ?? ti?m tng no, m ny s? ???c g?i ??n phng th nghi?m ?? xt nghi?m.  N?u pht hi?n th?y cc polip nh?, chuyn gia ch?m [place] s?c kh?e c?a qu v? c th? l?y cc polip ? v mang ?i ki?m tra ?? xem c t? bo ung th? khng.  ?ng ??a vo h?u mn c?a qu v? s? ???c t? t? rt ra. Th? thu?t ny c th? khc nhau gi?a cc chuyn gia ch?m [place] s?c kh?e v cc b?nh vi?n. ?i?u g x?y ra sau khi lm th? thu?t?  Huy?t p, nh?p tim, nh?p th? v n?ng ?? oxi trong mu c?a qu v? s? ???c  theo di cho ??n khi thu?c qu v? ? dng h?t tc d?ng.  Khng li xe trong vng 24 gi? sau khi th?m khm.  Qu v? c th? c m?t l??ng mu nh? trong phn.  Qu v? c th? [place] ti?n v b? co th?t ho?c ch??ng b?ng nh? do khng kh ? ???c s? d?ng ?? lm ph?ng ??i trng c?a qu v? trong lc th?m khm.  Qu v? ???c ty  l?y k?t qu? th? thu?t c?a mnh. Hy h?i chuyn gia ch?m [place] s?c kh?e ho?c khoa th?c hi?n thu? thu?t ?? bi?t khi no c k?t qu? c?a qu v?. Thng tin ny khng nh?m m?c ?ch thay  th? cho l?i khuyn m chuyn gia ch?m [place] s?c kh?e ni v?i qu v?. Hy b?o ??m qu v? ph?i th?o lu?n b?t k? v?n ?? g m qu v? c v?i chuyn gia ch?m [place] s?c kh?e c?a qu v?. Document Released: 01/13/2005 Document Revised: 03/18/2016 Document Reviewed: 06/17/2015 Elsevier Interactive Patient Education  2018 [place].   ?au l?ng, Ng??i l?n (Back Pain, Adult) ?au l?ng r?t ph? bi?n ? ng??i l?n.?au l?ng t khi gy nguy hi?m v c?n ?au th??ng ??? h?n theo th?i gian.C th? khng r nguyn nhn gy ?au l?ng. M?t s? nguyn nhn ph? bi?n gy ?au l?ng bao g?m:  C?ng c? ho?c dy ch?ng h? tr? c?t s?ng.  Hao mn (thoi ha) ??a ??m c?t s?ng.  Vim kh?p.  T?n th??ng tr?c ti?p ?? l?ng. ??i v?i nhi?u ng??i, ?au l?ng c th? ta?i pha?t. V ?au l?ng hi?m khi nguy hi?m, h?u h?t m?i ng??i c th? t?? tm hi?u ca?ch qua?n ly? ti?nh tra?ng na?y. H??NG D?N CH?M [place] T?I NH Theo do?i xem ?au l?ng c?a quy? vi? co? b?t ky? thay ??i na?o khng. Nh?ng hnh ??ng sau c th? gip gi?m b?t b?t c? c?m gic kh ch?u no qu v? ?ang c:  Duy tri? ca?c ho?t ??ng. L?ng cu?a quy? vi? se? bi? c?ng th??ng n?u ng?i ho??c ???ng ?? m?t ch? trong th?i gian di. Khng ng?i, la?i xe, ho?c ??ng ? m?t ch? trong h?n 30 pht m?i l?n. Hy ?i b? ca?c qua?ng ng?n trn n?n ph??ng ngay khi quy? vi? c th?.C? g?ng t?ng th?i gian ?i b? m?i ngy.  T?p th? d?c th??ng xuyn theo ch? d?n c?a chuyn gia ch?m [place] s?c kh?e. T?p th? d?c gip l?ng lnh nhanh h?n. N c?ng gip trnh b? th??ng t?n trong t??ng lai b?ng cch gi?? cho c? cu?a quy? vi? m?nh m? v linh ho?t.  Khng n??m la?i trn gi??ng.Ngh? ng?i nhi?u h?n 1-2 ngy c th? la?m ch?m vi?c ph?c h?i c?a quy? vi?.  Ch  ??n c? th? c?a quy? vi? khi g?p ng???i v nng v?t n??ng. Nh??ng v? tr tho?i mi nh?t l nh?ng vi? tri? ta?o t c?ng th?ng h?n cho vi?c phu?c h?i l?ng cu?a quy? vi?. Lun lun s? d?ng ?ng k? thu?t nng, bao g?m: ? G?p ??u g?i c?a quy? vi?. ? Gi? v?t c?n  nng g?n v?i c? th? c?a quy? vi?. ? Trnh v??n ng???i.  Tm m?t v? tr tho?i mi ?? ng?. S? d?ng m?t t?m n?m c??ng v n?m nghing v?i ??u g?i h?i cong. N?u quy? vi? n??m ng??a, ??t m?t chi?c g?i d??i ??u g?i c?a quy? vi?.  Trnh c?m gic lo l?ng ho?c c?ng th?ng.C?ng th?ng la?m c? c?ng th?ng h?n v c th? lm ?au l?ng tr?m tr?ng thm.?i?u quan tr?ng la? pha?i nh?n  ra khi na?o quy? vi? ?ang lo l?ng ho?c c?ng th?ng v tm hi?u cch ?? qu?n l lo l??ng ho??c c?ng th??ng, ch?ng h?n nh? b??ng ca?ch t?p th? d?c.  Ch? s? d?ng thu?c theo ch? d?n c?a chuyn gia ch?m [place] s?c kh?e. Thu?c gi?m ?au v kha?ng vim khng c?n k ??n th??ng l h?u ch nh?t.Chuyn gia ch?m [place] s?c kh?e c th? k thu?c th? gin c?.Nh?ng thu?c ny gip gia?m b??t ?au nn quy? vi? c th? tr?? la?i v??i ca?c ho?t ??ng ba?i t?p d?c bi?nh th???ng c?a quy? vi? nhanh h?n.  Ch??m ? l?nh vo vng b? t?n th??ng: ? Cho ? l?nh vo ti nh?a. ? ?? kh?n t?m ? gi?a da v ti ch??m. ? ?? ? l?nh trong kho?ng 20 pht, 2-3 l?n m?i ngy trong 2-3 ngy ??u. Sau ?, ch???m la?nh va? no?ng xen ke? ?? gi?m ?au v co th?t.  Duy tr cn n?ng c l?i cho s?c kh?e. Cn n??ng qua? m??c ta?o thm c?ng th?ng cho l?ng c?a quy? vi? v lm cho kh duy tr t? th? t?t. ?I [facility] N?U:  Qu v? b? ?au b?ng ma? khng thuyn gi?m sau khi nghi? ng?i ho??c dng thu?c.  Quy? vi? bi? ?au t?ng ln lan xu?ng d???i chn ho?c mng.  Quy? vi? bi? ?au m khng ??? trong m?t tu?n.  Quy? vi? bi? ?au va?o ban ?m.  Qu v? b? s?t cn.  Qu v? b? s?t ho?c ?n l?nh.  NGAY L?P T?C ?I KHM N?U:  Quy? vi? bi? nh??ng v?n ?? m??i v? ki?m soa?t ?a?i ti?n ho??c ti?u ti?n.  Qu v? b? y?u ho??c t b b?t th???ng ?? tay ho?c chn.  Quy? vi? bu?n nn ho?c nn.  Qu v? b? ?au b?ng.  Quy? vi? ca?m th?y bi? ng?t.  Thng tin ny khng nh?m m?c ?ch thay th? cho l?i khuyn m chuyn gia ch?m [place] s?c kh?e ni v?i qu v?. Hy b?o ??m qu v? ph?i th?o lu?n b?t k? v?n ??  g m qu v? c v?i chuyn gia ch?m [place] s?c kh?e c?a qu v?. Document Released: 07/28/2015 Document Revised: 07/28/2015 Document Reviewed: 08/07/2013 Elsevier Interactive Patient Education  2017 [place] Maintenance, Male A healthy lifestyle and preventive care is important for your health and wellness. Ask your health care provider about what schedule of regular examinations is right for you. What should I know about weight and diet? Eat a Healthy Diet  Eat plenty of vegetables, fruits, whole grains, low-fat dairy products, and lean protein.  Do not eat a lot of foods high in solid fats, added sugars, or salt.  Maintain a Healthy Weight Regular exercise can help you achieve or maintain a healthy weight. You should:  Do at least 150 minutes of exercise each week. The exercise should increase your heart rate and make you sweat (moderate-intensity exercise).  Do strength-training exercises at least twice a week.  Watch Your Levels of Cholesterol and Blood Lipids  Have your blood tested for lipids and cholesterol every 5 years starting at 55 years of age. If you are at high risk for heart disease, you should start having your blood tested when you are 55 years old. You may need to have your cholesterol levels checked more often if: ? Your lipid or cholesterol levels are high. ? You are older than 55 years of age. ? You are at high risk for heart disease.  What should I know about cancer screening? Many  types of cancers can be detected early and may often be prevented. Lung Cancer  You should be screened every year for lung cancer if: ? You are a current smoker who has smoked for at least 30 years. ? You are a former smoker who has quit within the past 15 years.  Talk to your health care provider about your screening options, when you should start screening, and how often you should be screened.  Colorectal Cancer  Routine colorectal cancer screening usually begins at  55 years of age and should be repeated every 5-10 years until you are 55 years old. You may need to be screened more often if early forms of precancerous polyps or small growths are found. Your health care provider may recommend screening at an earlier age if you have risk factors for colon cancer.  Your health care provider may recommend using home test kits to check for hidden blood in the stool.  A small camera at the end of a tube can be used to examine your colon (sigmoidoscopy or colonoscopy). This checks for the earliest forms of colorectal cancer.  Prostate and Testicular Cancer  Depending on your age and overall health, your health care provider may do certain tests to screen for prostate and testicular cancer.  Talk to your health care provider about any symptoms or concerns you have about testicular or prostate cancer.  Skin Cancer  Check your skin from head to toe regularly.  Tell your health care provider about any new moles or changes in moles, especially if: ? There is a change in a mole's size, shape, or color. ? You have a mole that is larger than a pencil eraser.  Always use sunscreen. Apply sunscreen liberally and repeat throughout the day.  Protect yourself by wearing long sleeves, pants, a wide-brimmed hat, and sunglasses when outside.  What should I know about heart disease, diabetes, and high blood pressure?  If you are 67-71 years of age, have your blood pressure checked every 3-5 years. If you are 18 years of age or older, have your blood pressure checked every year. You should have your blood pressure measured twice-once when you are at a hospital or clinic, and once when you are not at a hospital or clinic. Record the average of the two measurements. To check your blood pressure when you are not at a hospital or clinic, you can use: ? An automated blood pressure machine at a pharmacy. ? A home blood pressure monitor.  Talk to your health care provider about  your target blood pressure.  If you are between 69-62 years old, ask your health care provider if you should take aspirin to prevent heart disease.  Have regular diabetes screenings by checking your fasting blood sugar level. ? If you are at a normal weight and have a low risk for diabetes, have this test once every three years after the age of 4. ? If you are overweight and have a high risk for diabetes, consider being tested at a younger age or more often.  A one-time screening for abdominal aortic aneurysm (AAA) by ultrasound is recommended for men aged 55-75 years who are current or former smokers. What should I know about preventing infection? Hepatitis B If you have a higher risk for hepatitis B, you should be screened for this virus. Talk with your health care provider to find out if you are at risk for hepatitis B infection. Hepatitis C Blood testing is recommended for:  Everyone  born from [birth year] through 1964.  Anyone with known risk factors for hepatitis C.  Sexually Transmitted Diseases (STDs)  You should be screened each year for STDs including gonorrhea and chlamydia if: ? You are sexually active and are younger than 55 years of age. ? You are older than 55 years of age and your health care provider tells you that you are at risk for this type of infection. ? Your sexual activity has changed since you were last screened and you are at an increased risk for chlamydia or gonorrhea. Ask your health care provider if you are at risk.  Talk with your health care provider about whether you are at high risk of being infected with HIV. Your health care provider may recommend a prescription medicine to help prevent HIV infection.  What else can I do?  Schedule regular health, dental, and eye exams.  Stay current with your vaccines (immunizations).  Do not use any tobacco products, such as cigarettes, chewing tobacco, and e-cigarettes. If you need help quitting, ask your health care  provider.  Limit alcohol intake to no more than 2 drinks per day. One drink equals 12 ounces of beer, 5 ounces of wine, or 1 ounces of hard liquor.  Do not use street drugs.  Do not share needles.  Ask your health care provider for help if you need support or information about quitting drugs.  Tell your health care provider if you often feel depressed.  Tell your health care provider if you have ever been abused or do not feel safe at home. This information is not intended to replace advice given to you by your health care provider. Make sure you discuss any questions you have with your health care provider. Document Released: 10/02/2007 Document Revised: 12/03/2015 Document Reviewed: 01/07/2015 Elsevier Interactive Patient Education  2018 [employer].  IF you received an x-ray today, you will receive an invoice from [facility]. Please contact [facility] at [phone] with questions or concerns regarding your invoice.   IF you received labwork today, you will receive an invoice from [facility]. Please contact [facility] at [phone] with questions or concerns regarding your invoice.   Our billing staff will not be able to assist you with questions regarding bills from these companies.  You will be contacted with the lab results as soon as they are available. The fastest way to get your results is to activate your My Chart account. Instructions are located on the last page of this paperwork. If you have not heard from [place] regarding the results in 2 weeks, please contact this office.

## 2017-01-04 NOTE — Progress Notes (Signed)
Primary Care at [facility] [address], [place] [address] [phone]  Date:  01/04/2017   Name:  Cory Murphy   DOB:  Apr 15, 1961   MRN:  [id number]  PCP:  Patient, No Pcp Per    Chief Complaint: Annual Exam and Back Pain (mid to lower x 2 wks)   History of Present Illness:  This is a 56 y.o. male with PMH gout who is presenting for CPE. He speaks [place] Works as a Psychologist, occupational. He is here today with his partner. They live together. He has never had an routine medical exam.   Complaints:  Back pain x 2 week. Mid back, both sides. Hurts worse with movement.  Sometimes takes Aleve and this helps.  Immunizations: UTD Dentist: never. Brushes teeth 2x/day, does not floss Eye: 20/20 b/l Diet: rice, fish, meat. No fruit. Not a lot of fast food.  Exercise: none Fam hx: healthy  Urinary hesitancy/frequency or nocturia: increased amount he urinates. Urinates 4-5 times a day. This is up from 2-3 times a day. Denies hesitancy, incomplete voiding, increased urge.  Tobacco/alcohol/substance use: Quit a month ago. Smoked 1/2 pack/day x 25 years.   Colonoscopy: never   Review of Systems:  Review of Systems  Constitutional: Negative for activity change, appetite change, chills, diaphoresis and fatigue.  HENT: Negative for congestion, dental problem, sneezing and tinnitus.   Eyes: Negative for visual disturbance.  Respiratory: Negative for cough, chest tightness, shortness of breath and wheezing.   Cardiovascular: Negative for chest pain, palpitations and leg swelling.  Gastrointestinal: Negative for abdominal pain, blood in stool, constipation, diarrhea, nausea and vomiting.  Endocrine: Negative for polydipsia, polyphagia and polyuria.  Genitourinary: Positive for frequency. Negative for decreased urine volume, difficulty urinating, discharge, hematuria, scrotal swelling and testicular pain.  Musculoskeletal: Positive for back pain. Negative for arthralgias, gait problem, neck pain and  neck stiffness.  Allergic/Immunologic: Negative for environmental allergies and food allergies.  Neurological: Negative for dizziness, syncope, weakness, light-headedness and headaches.  Psychiatric/Behavioral: Negative for sleep disturbance. The patient is not nervous/anxious.     Patient Active Problem List   Diagnosis Date Noted  . Language barrier, cultural differences 12/20/2012  . 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 Authorizing Provider  indomethacin (INDOCIN) 25 MG capsule Take 1 capsule (25 mg total) by mouth 3 (three) times daily with meals. Patient not taking: Reported on 01/04/2017 07/05/15   Peyton Najjar, MD    No Known Allergies  No past surgical history on file.  Social History  Substance Use Topics  . Smoking status: Former Smoker    Quit date: 11/17/2016  . Smokeless tobacco: Never Used  . Alcohol use Yes    No family history on file.  Medication list has been reviewed and updated.  Physical Examination:  Physical Exam  Constitutional: He is oriented to person, place, and time. He appears well-developed and well-nourished. No distress.  HENT:  Head: Normocephalic and atraumatic.  Right Ear: Tympanic membrane and external ear normal.  Left Ear: Tympanic membrane and external ear normal.  Nose: Nose normal.  Mouth/Throat: Oropharynx is clear and moist and mucous membranes are normal. Abnormal dentition. Dental caries present. No dental abscesses. No oropharyngeal exudate.  Eyes: Pupils are equal, round, and reactive to light. Conjunctivae and EOM are normal.  Neck: Normal range of motion. No thyromegaly present.  Cardiovascular: Normal rate, regular rhythm, S1 normal, S2 normal, normal heart sounds, intact  distal pulses and normal pulses.   No murmur heard. Pulmonary/Chest: Effort normal and breath sounds normal. No respiratory distress. He has no wheezes.  Abdominal: Soft. Bowel sounds are  normal. He exhibits no distension and no mass. There is no tenderness.  Musculoskeletal: Normal range of motion. He exhibits no edema.       Right ankle: He exhibits no swelling.       Left ankle: He exhibits no swelling.       Thoracic back: He exhibits tenderness. He exhibits normal range of motion, no bony tenderness, no pain and no spasm.  Lymphadenopathy:    He has no cervical adenopathy.  Neurological: He is alert and oriented to person, place, and time. He has normal reflexes.  Skin: Skin is warm and dry.  Psychiatric: He has a normal mood and affect. His behavior is normal. Judgment and thought content normal.  Vitals reviewed.   BP (!) 159/94   Pulse 71   Temp 99.2 F (37.3 C) (Oral)   Resp 16   Ht '5\' 3"'$  (1.6 m)   Wt 140 lb 6.4 oz (63.7 kg)   SpO2 98%   BMI 24.87 kg/m   Assessment and Plan: 1. Annual physical exam - Pt presents for his first routine medical exam. Plan to refer for colonoscopy. He also has very poor dental hygiene, referral to dentistry. Anticipatory guidance provided. Labs are pending. Will contact with results.  Estill Bamberg is daughter: [phone]. Comm should be made through her. He would like to receive letter with lab results.   2. Bilateral thoracic back pain, unspecified chronicity - cyclobenzaprine (FLEXERIL) 10 MG tablet; Take 1 tablet (10 mg total) by mouth 3 (three) times daily as needed for muscle spasms.  Dispense: 30 tablet; Refill: 0 - ibuprofen (ADVIL,MOTRIN) 600 MG tablet; Take 1 tablet (600 mg total) by mouth every 8 (eight) hours as needed.  Dispense: 30 tablet; Refill: 0 - Appears to be related to physically demanding job. Will treat. Supportive care discussed. RTC if no improvement in 3-4 weeks.  3. Screen for colon cancer - Ambulatory referral to Gastroenterology  4. Screening, lipid - Lipid panel  5. Screening for deficiency anemia - CBC with Differential/Platelet  6. Screening for diabetes mellitus - CMP14+EGFR  7. Encounter  for hepatitis C screening test for low risk patient - Hepatitis C antibody  8. Screening for HIV (human immunodeficiency virus) - HIV antibody  9. History of smoking - POCT urinalysis dipstick  10. Need for prophylactic vaccination and inoculation against influenza - Flu Vaccine QUAD 6+ mos PF IM (Fluarix Quad PF) 11. Poor dental hygeine - Ambulatory referral to dentistry   Mercer Pod, PA-C  [facility] 01/04/2017 9:49 AM

## 2017-01-05 ENCOUNTER — Encounter: Payer: Self-pay | Admitting: Physician Assistant

## 2017-01-05 LAB — CMP14+EGFR
ALT: 23 IU/L (ref 0–44)
AST: 25 IU/L (ref 0–40)
Albumin/Globulin Ratio: 1.6 (ref 1.2–2.2)
Albumin: 4.6 g/dL (ref 3.5–5.5)
Alkaline Phosphatase: 97 IU/L (ref 39–117)
BUN/Creatinine Ratio: 14 (ref 9–20)
BUN: 13 mg/dL (ref 6–24)
Bilirubin Total: 1.3 mg/dL — ABNORMAL HIGH (ref 0.0–1.2)
CO2: 24 mmol/L (ref 20–29)
Calcium: 9.5 mg/dL (ref 8.7–10.2)
Chloride: 101 mmol/L (ref 96–106)
Creatinine, Ser: 0.9 mg/dL (ref 0.76–1.27)
GFR calc Af Amer: 112 mL/min/{1.73_m2} (ref >59)
GFR calc non Af Amer: 96 mL/min/{1.73_m2} (ref >59)
Globulin, Total: 2.9 g/dL (ref 1.5–4.5)
Glucose: 93 mg/dL (ref 65–99)
Potassium: 3.9 mmol/L (ref 3.5–5.2)
Sodium: 140 mmol/L (ref 134–144)
Total Protein: 7.5 g/dL (ref 6.0–8.5)

## 2017-01-05 LAB — CBC WITH DIFFERENTIAL/PLATELET
Basophils Absolute: 0.1 10*3/uL (ref 0.0–0.2)
Basos: 1 %
EOS (ABSOLUTE): 0.5 10*3/uL — ABNORMAL HIGH (ref 0.0–0.4)
Eos: 6 %
Hematocrit: 45.1 % (ref 37.5–51.0)
Hemoglobin: 15 g/dL (ref 13.0–17.7)
Immature Grans (Abs): 0 10*3/uL (ref 0.0–0.1)
Immature Granulocytes: 0 %
Lymphocytes Absolute: 1.9 10*3/uL (ref 0.7–3.1)
Lymphs: 22 %
MCH: 30.5 pg (ref 26.6–33.0)
MCHC: 33.3 g/dL (ref 31.5–35.7)
MCV: 92 fL (ref 79–97)
Monocytes Absolute: 0.6 10*3/uL (ref 0.1–0.9)
Monocytes: 8 %
Neutrophils Absolute: 5.3 10*3/uL (ref 1.4–7.0)
Neutrophils: 63 %
Platelets: 329 10*3/uL (ref 150–379)
RBC: 4.91 x10E6/uL (ref 4.14–5.80)
RDW: 12.9 % (ref 12.3–15.4)
WBC: 8.4 10*3/uL (ref 3.4–10.8)

## 2017-01-05 LAB — LIPID PANEL
Chol/HDL Ratio: 3.6 ratio (ref 0.0–5.0)
Cholesterol, Total: 193 mg/dL (ref 100–199)
HDL: 54 mg/dL (ref >39)
LDL Calculated: 119 mg/dL — ABNORMAL HIGH (ref 0–99)
Triglycerides: 99 mg/dL (ref 0–149)
VLDL Cholesterol Cal: 20 mg/dL (ref 5–40)

## 2017-01-05 LAB — HEPATITIS C ANTIBODY: Hep C Virus Ab: <0.1 s/co ratio (ref 0.0–0.9)

## 2017-01-05 LAB — HIV ANTIBODY (ROUTINE TESTING W REFLEX): HIV Screen 4th Generation wRfx: NONREACTIVE

## 2017-01-05 NOTE — Progress Notes (Signed)
Great news! Your labs look fantastic - kidney and liver function, blood count, electrolytes in your blood are great. You are negative for HIV and Hepatitis C. Your LDL cholesterol is a little bit elevated. I would recommend eating healthier, cut down on fatty foods, fried foods, limit red meat to once a week, eat balanced meals including a serving of vegetables and/or fruits with every meal. Also start exercising at least 2-3 times per week to help with cholesterol.  Come back and see me in 1 year for annual exam. Please consider getting your colonoscopy to screen for colon cancer. Information was printed out on your after visit summary.

## 2017-01-20 ENCOUNTER — Telehealth: Payer: Self-pay | Admitting: Physician Assistant

## 2017-01-20 NOTE — Telephone Encounter (Signed)
Pt wife came in stating that pt needs a refill on cyclobenzaprine (FLEXERIL) 10 MG tablet [[id number]], and ibuprofen (ADVIL,MOTRIN) 600 MG tablet [[id number]] pt is completely out  Please advise

## 2017-01-21 ENCOUNTER — Other Ambulatory Visit: Payer: Self-pay | Admitting: Physician Assistant

## 2017-01-21 DIAGNOSIS — M546 Pain in thoracic spine: Secondary | ICD-10-CM

## 2017-01-21 MED ORDER — CYCLOBENZAPRINE HCL 10 MG PO TABS: 10.0000 mg | ORAL_TABLET | Freq: Three times a day (TID) | ORAL | 0 refills | Status: DC | PRN

## 2017-01-21 MED ORDER — IBUPROFEN 600 MG PO TABS: 600.0000 mg | ORAL_TABLET | Freq: Three times a day (TID) | ORAL | 0 refills | Status: DC | PRN

## 2017-03-16 ENCOUNTER — Encounter: Payer: Self-pay | Admitting: Physician Assistant

## 2019-01-02 ENCOUNTER — Ambulatory Visit: Payer: BC Managed Care – PPO | Admitting: Family Medicine

## 2019-01-02 ENCOUNTER — Encounter: Payer: Self-pay | Admitting: Family Medicine

## 2019-01-02 ENCOUNTER — Other Ambulatory Visit: Payer: Self-pay

## 2019-01-02 ENCOUNTER — Ambulatory Visit (INDEPENDENT_AMBULATORY_CARE_PROVIDER_SITE_OTHER): Payer: BC Managed Care – PPO

## 2019-01-02 VITALS — BP 163/82 | HR 82 | Temp 97.8°F | Resp 14 | Wt 132.6 lb

## 2019-01-02 DIAGNOSIS — M542 Cervicalgia: Secondary | ICD-10-CM | POA: Diagnosis not present

## 2019-01-02 DIAGNOSIS — S50312A Abrasion of left elbow, initial encounter: Secondary | ICD-10-CM

## 2019-01-02 DIAGNOSIS — M25422 Effusion, left elbow: Secondary | ICD-10-CM | POA: Diagnosis not present

## 2019-01-02 DIAGNOSIS — Z8739 Personal history of other diseases of the musculoskeletal system and connective tissue: Secondary | ICD-10-CM

## 2019-01-02 MED ORDER — METHOCARBAMOL 500 MG PO TABS
ORAL_TABLET | ORAL | 1 refills | Status: DC
Start: 2019-01-02 — End: 2020-02-13

## 2019-01-02 MED ORDER — DICLOFENAC SODIUM 75 MG PO TBEC: DELAYED_RELEASE_TABLET | ORAL | 0 refills | Status: DC
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
## 2019-01-02 NOTE — Patient Instructions (Addendum)
  Take diclofenac 1 twice daily for pain and inflammation.  When you are feeling better you can discontinue this.  It should help both the neck and the elbow.  Take the muscle relaxant, Robaxin (methocarbamol) 1 pill in the morning, 1 in the afternoon, and 1 or 2 at bedtime for muscle relaxant for neck.  Apply a pack of ice on the neck several times a day for about 10 to 15 minutes to help calm it down.  Wear an Ace wrap for the next week or 2 around that elbow.  If the cyst on the elbow continues to persist we will have to aspirate it with a needle and inject some medicine into it.  Return as needed.  If you have lab work done today you will be contacted with your lab results within the next 2 weeks.  If you have not heard from [place] then please contact us. The fastest way to get your results is to register for My Chart.   IF you received an x-ray today, you will receive an invoice from [facility]. Please contact [facility] at [phone] with questions or concerns regarding your invoice.   IF you received labwork today, you will receive an invoice from [place]. Please contact [facility] at [phone] with questions or concerns regarding your invoice.   Our billing staff will not be able to assist you with questions regarding bills from these companies.  You will be contacted with the lab results as soon as they are available. The fastest way to get your results is to activate your My Chart account. Instructions are located on the last page of this paperwork. If you have not heard from [place] regarding the results in 2 weeks, please contact this office.

## 2019-01-23 ENCOUNTER — Other Ambulatory Visit: Payer: Self-pay

## 2019-01-23 ENCOUNTER — Encounter: Payer: Self-pay | Admitting: Registered Nurse

## 2019-01-23 ENCOUNTER — Ambulatory Visit: Payer: BC Managed Care – PPO | Admitting: Registered Nurse

## 2019-01-23 VITALS — BP 148/88 | HR 67 | Temp 99.0°F | Resp 16 | Ht 63.78 in | Wt 132.0 lb

## 2019-01-23 DIAGNOSIS — Z23 Encounter for immunization: Secondary | ICD-10-CM | POA: Diagnosis not present

## 2019-01-23 DIAGNOSIS — Z7689 Persons encountering health services in other specified circumstances: Secondary | ICD-10-CM

## 2019-01-23 DIAGNOSIS — Z1329 Encounter for screening for other suspected endocrine disorder: Secondary | ICD-10-CM | POA: Diagnosis not present

## 2019-01-23 DIAGNOSIS — Z130 Encounter for screening for diseases of the blood and blood-forming organs and certain disorders involving the immune mechanism: Secondary | ICD-10-CM

## 2019-01-23 DIAGNOSIS — Z13228 Encounter for screening for other metabolic disorders: Secondary | ICD-10-CM | POA: Diagnosis not present

## 2019-01-23 DIAGNOSIS — F411 Generalized anxiety disorder: Secondary | ICD-10-CM | POA: Diagnosis not present

## 2019-01-23 DIAGNOSIS — Z0001 Encounter for general adult medical examination with abnormal findings: Secondary | ICD-10-CM | POA: Diagnosis not present

## 2019-01-23 DIAGNOSIS — Z0000 Encounter for general adult medical examination without abnormal findings: Secondary | ICD-10-CM

## 2019-01-23 DIAGNOSIS — Z13220 Encounter for screening for lipoid disorders: Secondary | ICD-10-CM | POA: Diagnosis not present

## 2019-01-23 DIAGNOSIS — Z1211 Encounter for screening for malignant neoplasm of colon: Secondary | ICD-10-CM

## 2019-01-23 MED ORDER — BUSPIRONE HCL 7.5 MG PO TABS: 7.5000 mg | ORAL_TABLET | Freq: Three times a day (TID) | ORAL | 0 refills | Status: AC

## 2019-01-23 NOTE — Patient Instructions (Signed)
° ° ° °  If you have lab work done today you will be contacted with your lab results within the next 2 weeks.  If you have not heard from us then please contact us. The fastest way to get your results is to register for My Chart. ° ° °IF you received an x-ray today, you will receive an invoice from [facility]. Please contact [facility] at [phone] with questions or concerns regarding your invoice.  ° °IF you received labwork today, you will receive an invoice from [facility]. Please contact [facility] at [phone] with questions or concerns regarding your invoice.  ° °Our billing staff will not be able to assist you with questions regarding bills from these companies. ° °You will be contacted with the lab results as soon as they are available. The fastest way to get your results is to activate your My Chart account. Instructions are located on the last page of this paperwork. If you have not heard from us regarding the results in 2 weeks, please contact this office. °  ° ° ° °
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
## 2019-01-24 ENCOUNTER — Encounter: Payer: Self-pay | Admitting: Registered Nurse

## 2019-01-24 LAB — COMPREHENSIVE METABOLIC PANEL
ALT: 12 IU/L (ref 0–44)
AST: 21 IU/L (ref 0–40)
Albumin/Globulin Ratio: 1.4 (ref 1.2–2.2)
Albumin: 4.2 g/dL (ref 3.8–4.9)
Alkaline Phosphatase: 114 IU/L (ref 39–117)
BUN/Creatinine Ratio: 19 (ref 9–20)
BUN: 14 mg/dL (ref 6–24)
Bilirubin Total: 0.9 mg/dL (ref 0.0–1.2)
CO2: 23 mmol/L (ref 20–29)
Calcium: 9.4 mg/dL (ref 8.7–10.2)
Chloride: 106 mmol/L (ref 96–106)
Creatinine, Ser: 0.74 mg/dL — ABNORMAL LOW (ref 0.76–1.27)
GFR calc Af Amer: 119 mL/min/{1.73_m2} (ref >59)
GFR calc non Af Amer: 103 mL/min/{1.73_m2} (ref >59)
Globulin, Total: 3.1 g/dL (ref 1.5–4.5)
Glucose: 90 mg/dL (ref 65–99)
Potassium: 3.9 mmol/L (ref 3.5–5.2)
Sodium: 142 mmol/L (ref 134–144)
Total Protein: 7.3 g/dL (ref 6.0–8.5)

## 2019-01-24 LAB — TSH: TSH: 1.09 u[IU]/mL (ref 0.450–4.500)

## 2019-01-24 LAB — CBC WITH DIFFERENTIAL/PLATELET
Basophils Absolute: 0.1 10*3/uL (ref 0.0–0.2)
Basos: 1 %
EOS (ABSOLUTE): 0.3 10*3/uL (ref 0.0–0.4)
Eos: 5 %
Hematocrit: 42.5 % (ref 37.5–51.0)
Hemoglobin: 14.2 g/dL (ref 13.0–17.7)
Immature Grans (Abs): 0 10*3/uL (ref 0.0–0.1)
Immature Granulocytes: 0 %
Lymphocytes Absolute: 1.8 10*3/uL (ref 0.7–3.1)
Lymphs: 31 %
MCH: 29.7 pg (ref 26.6–33.0)
MCHC: 33.4 g/dL (ref 31.5–35.7)
MCV: 89 fL (ref 79–97)
Monocytes Absolute: 0.4 10*3/uL (ref 0.1–0.9)
Monocytes: 7 %
Neutrophils Absolute: 3.3 10*3/uL (ref 1.4–7.0)
Neutrophils: 56 %
Platelets: 307 10*3/uL (ref 150–450)
RBC: 4.78 x10E6/uL (ref 4.14–5.80)
RDW: 12 % (ref 11.6–15.4)
WBC: 6 10*3/uL (ref 3.4–10.8)

## 2019-01-24 LAB — LIPID PANEL
Chol/HDL Ratio: 3.6 ratio (ref 0.0–5.0)
Cholesterol, Total: 188 mg/dL (ref 100–199)
HDL: 52 mg/dL (ref >39)
LDL Chol Calc (NIH): 123 mg/dL — ABNORMAL HIGH (ref 0–99)
Triglycerides: 70 mg/dL (ref 0–149)
VLDL Cholesterol Cal: 13 mg/dL (ref 5–40)

## 2019-01-24 LAB — HEMOGLOBIN A1C
Est. average glucose Bld gHb Est-mCnc: 120 mg/dL
Hgb A1c MFr Bld: 5.8 % — ABNORMAL HIGH (ref 4.8–5.6)

## 2019-01-24 NOTE — Progress Notes (Signed)
Results not concerning at this time. Mild elevation in A1c, mild elevation in LDL. As requested by patient, letter to be sent.  Kathrin Ruddy, NP

## 2019-02-22 ENCOUNTER — Encounter: Payer: Self-pay | Admitting: Registered Nurse

## 2019-07-23 ENCOUNTER — Ambulatory Visit: Payer: Self-pay | Attending: Internal Medicine

## 2019-07-23 DIAGNOSIS — Z23 Encounter for immunization: Secondary | ICD-10-CM

## 2019-07-23 NOTE — Progress Notes (Signed)
   Covid-19 Vaccination Clinic  Name:  Cory Murphy    MRN: [id number] DOB: 1960/07/03  07/23/2019  Mr. Cory Murphy was observed post Covid-19 immunization for 15 minutes without incident. He was provided with Vaccine Information Sheet and instruction to access the V-Safe system.   Mr. Cory Murphy was instructed to call [phone] with any severe reactions post vaccine: Marland Kitchen Difficulty breathing  . Swelling of face and throat  . A fast heartbeat  . A bad rash all over body  . Dizziness and weakness   Immunizations Administered    Name Date Dose VIS Date Route   Pfizer COVID-19 Vaccine 07/23/2019  1:02 PM 0.3 mL 03/30/2019 Intramuscular   Manufacturer: [employer], [employer]   Lot: [id number]   NDC: [address]-3

## 2019-07-24 ENCOUNTER — Other Ambulatory Visit: Payer: Self-pay

## 2019-07-24 ENCOUNTER — Ambulatory Visit: Payer: BC Managed Care – PPO | Admitting: Registered Nurse

## 2019-07-24 ENCOUNTER — Encounter: Payer: Self-pay | Admitting: Registered Nurse

## 2019-07-24 VITALS — BP 160/86 | HR 71 | Temp 97.4°F | Resp 16 | Ht 63.0 in | Wt 136.0 lb

## 2019-07-24 DIAGNOSIS — M10072 Idiopathic gout, left ankle and foot: Secondary | ICD-10-CM | POA: Diagnosis not present

## 2019-07-24 DIAGNOSIS — Z13228 Encounter for screening for other metabolic disorders: Secondary | ICD-10-CM

## 2019-07-24 DIAGNOSIS — Z13220 Encounter for screening for lipoid disorders: Secondary | ICD-10-CM

## 2019-07-24 DIAGNOSIS — Z1211 Encounter for screening for malignant neoplasm of colon: Secondary | ICD-10-CM

## 2019-07-24 DIAGNOSIS — Z130 Encounter for screening for diseases of the blood and blood-forming organs and certain disorders involving the immune mechanism: Secondary | ICD-10-CM

## 2019-07-24 DIAGNOSIS — Z1329 Encounter for screening for other suspected endocrine disorder: Secondary | ICD-10-CM | POA: Diagnosis not present

## 2019-07-24 MED ORDER — INDOMETHACIN 50 MG PO CAPS: 50.0000 mg | ORAL_CAPSULE | Freq: Three times a day (TID) | ORAL | 0 refills | Status: AC

## 2019-07-24 NOTE — Patient Instructions (Signed)
° ° ° °  If you have lab work done today you will be contacted with your lab results within the next 2 weeks.  If you have not heard from us then please contact us. The fastest way to get your results is to register for My Chart. ° ° °IF you received an x-ray today, you will receive an invoice from [facility]. Please contact [facility] at [phone] with questions or concerns regarding your invoice.  ° °IF you received labwork today, you will receive an invoice from [facility]. Please contact [facility] at [phone] with questions or concerns regarding your invoice.  ° °Our billing staff will not be able to assist you with questions regarding bills from these companies. ° °You will be contacted with the lab results as soon as they are available. The fastest way to get your results is to activate your My Chart account. Instructions are located on the last page of this paperwork. If you have not heard from us regarding the results in 2 weeks, please contact this office. °  ° ° ° °

## 2019-07-25 LAB — TSH: TSH: 0.953 u[IU]/mL (ref 0.450–4.500)

## 2019-07-25 LAB — CBC WITH DIFFERENTIAL/PLATELET
Basophils Absolute: 0 10*3/uL (ref 0.0–0.2)
Basos: 1 %
EOS (ABSOLUTE): 0.4 10*3/uL (ref 0.0–0.4)
Eos: 5 %
Hematocrit: 43.2 % (ref 37.5–51.0)
Hemoglobin: 14.3 g/dL (ref 13.0–17.7)
Immature Grans (Abs): 0 10*3/uL (ref 0.0–0.1)
Immature Granulocytes: 0 %
Lymphocytes Absolute: 1.8 10*3/uL (ref 0.7–3.1)
Lymphs: 22 %
MCH: 29.9 pg (ref 26.6–33.0)
MCHC: 33.1 g/dL (ref 31.5–35.7)
MCV: 90 fL (ref 79–97)
Monocytes Absolute: 0.5 10*3/uL (ref 0.1–0.9)
Monocytes: 6 %
Neutrophils Absolute: 5.3 10*3/uL (ref 1.4–7.0)
Neutrophils: 66 %
Platelets: 374 10*3/uL (ref 150–450)
RBC: 4.78 x10E6/uL (ref 4.14–5.80)
RDW: 12.1 % (ref 11.6–15.4)
WBC: 8 10*3/uL (ref 3.4–10.8)

## 2019-07-25 LAB — LIPID PANEL
Chol/HDL Ratio: 2.9 ratio (ref 0.0–5.0)
Cholesterol, Total: 159 mg/dL (ref 100–199)
HDL: 55 mg/dL (ref >39)
LDL Chol Calc (NIH): 92 mg/dL (ref 0–99)
Triglycerides: 58 mg/dL (ref 0–149)
VLDL Cholesterol Cal: 12 mg/dL (ref 5–40)

## 2019-07-25 LAB — COMPREHENSIVE METABOLIC PANEL
ALT: 7 IU/L (ref 0–44)
AST: 18 IU/L (ref 0–40)
Albumin/Globulin Ratio: 1.6 (ref 1.2–2.2)
Albumin: 4.2 g/dL (ref 3.8–4.9)
Alkaline Phosphatase: 125 IU/L — ABNORMAL HIGH (ref 39–117)
BUN/Creatinine Ratio: 27 — ABNORMAL HIGH (ref 9–20)
BUN: 18 mg/dL (ref 6–24)
Bilirubin Total: 0.4 mg/dL (ref 0.0–1.2)
CO2: 20 mmol/L (ref 20–29)
Calcium: 9.1 mg/dL (ref 8.7–10.2)
Chloride: 108 mmol/L — ABNORMAL HIGH (ref 96–106)
Creatinine, Ser: 0.67 mg/dL — ABNORMAL LOW (ref 0.76–1.27)
GFR calc Af Amer: 123 mL/min/{1.73_m2} (ref >59)
GFR calc non Af Amer: 107 mL/min/{1.73_m2} (ref >59)
Globulin, Total: 2.7 g/dL (ref 1.5–4.5)
Glucose: 86 mg/dL (ref 65–99)
Potassium: 4.1 mmol/L (ref 3.5–5.2)
Sodium: 142 mmol/L (ref 134–144)
Total Protein: 6.9 g/dL (ref 6.0–8.5)

## 2019-07-25 LAB — URIC ACID: Uric Acid: 5.9 mg/dL (ref 3.8–8.4)

## 2019-07-25 LAB — HEMOGLOBIN A1C
Est. average glucose Bld gHb Est-mCnc: 117 mg/dL
Hgb A1c MFr Bld: 5.7 % — ABNORMAL HIGH (ref 4.8–5.6)

## 2019-07-31 ENCOUNTER — Encounter: Payer: Self-pay | Admitting: Radiology

## 2019-07-31 NOTE — Progress Notes (Signed)
Good morning,  Normal results letter to Cory Murphy, please!  Thank you,  Jari Sportsman, NP

## 2019-08-02 NOTE — Progress Notes (Signed)
Established Patient Office Visit  Subjective:  Patient ID: Cory Murphy, male    DOB: 07-01-1960  Age: 59 y.o. MRN: [id number]  CC:  Chief Complaint  Patient presents with  . Follow-up    6 month follow up . per patient he is having pain and swelling in his left foot due to gout but has been taking tylenol.    HPI Cory Murphy presents for 6 mo follow up for htn C/o pain and swelling in l foot d/t gout. Taking tylenol, not effective Has had this before, no new symptoms. No hx of dvt  Otherwise he feels well, reports med compliance, no new complications  BP elevated but he feels this is d.t current pain Denies chest pain, headache, shob, doe, visual changes, claudication  BP at home has been normal  Past Medical History:  Diagnosis Date  . Back pain     No past surgical history on file.  No family history on file.  Social History   Socioeconomic History  . Marital status: Significant Other    Spouse name: Not on file  . Number of children: Not on file  . Years of education: Not on file  . Highest education level: Not on file  Occupational History  . Occupation: Welder  Tobacco Use  . Smoking status: Former Smoker    Quit date: 11/17/2016    Years since quitting: 2.7  . Smokeless tobacco: Never Used  Substance and Sexual Activity  . Alcohol use: Yes    Comment: less frequently d/t gout  . Drug use: No  . Sexual activity: Yes  Other Topics Concern  . Not on file  Social History Narrative   Significant other. Education: [employer].    Social Determinants of Health   Financial Resource Strain: Low Risk   . Difficulty of Paying Living Expenses: Not hard at all  Food Insecurity: No Food Insecurity  . Worried About Charity fundraiser in the Last Year: Never true  . Ran Out of Food in the Last Year: Never true  Transportation Needs: No Transportation Needs  . Lack of Transportation (Medical): No  . Lack of Transportation (Non-Medical): No  Physical  Activity: Unknown  . Days of Exercise per Week: Patient refused  . Minutes of Exercise per Session: Patient refused  Stress: Stress Concern Present  . Feeling of Stress : To some extent  Social Connections: Unknown  . Frequency of Communication with Friends and Family: Twice a week  . Frequency of Social Gatherings with Friends and Family: Once a week  . Attends Religious Services: Patient refused  . Active Member of Clubs or Organizations: Patient refused  . Attends Archivist Meetings: Patient refused  . Marital Status: Living with partner  Intimate Partner Violence: Not At Risk  . Fear of Current or Ex-Partner: No  . Emotionally Abused: No  . Physically Abused: No  . Sexually Abused: No    Outpatient Medications Prior to Visit  Medication Sig Dispense Refill  . busPIRone (BUSPAR) 7.5 MG tablet Take 1 tablet (7.5 mg total) by mouth 3 (three) times daily. (Patient not taking: Reported on 07/24/2019) 90 tablet 0  . methocarbamol (ROBAXIN) 500 MG tablet Take 1 in the morning, 1 in the afternoon, and 1-2 at bedtime as needed for muscle relaxant for neck (Patient not taking: Reported on 07/24/2019) 30 tablet 1   No facility-administered medications prior to visit.    No Known Allergies  ROS Review of Systems  Constitutional:  Negative.   HENT: Negative.   Eyes: Negative.   Respiratory: Negative.   Cardiovascular: Negative.   Gastrointestinal: Negative.   Endocrine: Negative.   Genitourinary: Negative.   Musculoskeletal: Positive for arthralgias, gait problem and joint swelling. Negative for back pain, myalgias, neck pain and neck stiffness.  Skin: Negative.   Allergic/Immunologic: Negative.   Hematological: Negative.   All other systems reviewed and are negative.     Objective:    Physical Exam  Constitutional: He is oriented to person, place, and time. He appears well-developed and well-nourished. No distress.  Cardiovascular: Normal rate, regular rhythm and  normal heart sounds. Exam reveals no gallop and no friction rub.  No murmur heard. Pulmonary/Chest: Effort normal and breath sounds normal. No respiratory distress. He has no wheezes. He has no rales. He exhibits no tenderness.  Musculoskeletal:        General: Tenderness and edema present. No deformity. Normal range of motion.  Neurological: He is alert and oriented to person, place, and time.  Skin: Skin is warm and dry. No rash noted. He is not diaphoretic. No erythema. No pallor.  Psychiatric: He has a normal mood and affect. His behavior is normal. Judgment and thought content normal.  Nursing note and vitals reviewed.   BP (!) 160/86   Pulse 71   Temp (!) 97.4 F (36.3 C) (Temporal)   Resp 16   Ht 5\' 3"  (1.6 m)   Wt 136 lb (61.7 kg)   SpO2 98%   BMI 24.09 kg/m  Wt Readings from Last 3 Encounters:  07/24/19 136 lb (61.7 kg)  01/23/19 132 lb (59.9 kg)  01/02/19 132 lb 9.6 oz (60.1 kg)     Health Maintenance Due  Topic Date Due  . COLONOSCOPY  Never done    There are no preventive care reminders to display for this patient.  Lab Results  Component Value Date   TSH 0.953 07/24/2019   Lab Results  Component Value Date   WBC 8.0 07/24/2019   HGB 14.3 07/24/2019   HCT 43.2 07/24/2019   MCV 90 07/24/2019   PLT 374 07/24/2019   Lab Results  Component Value Date   NA 142 07/24/2019   K 4.1 07/24/2019   CO2 20 07/24/2019   GLUCOSE 86 07/24/2019   BUN 18 07/24/2019   CREATININE 0.67 (L) 07/24/2019   BILITOT 0.4 07/24/2019   ALKPHOS 125 (H) 07/24/2019   AST 18 07/24/2019   ALT 7 07/24/2019   PROT 6.9 07/24/2019   ALBUMIN 4.2 07/24/2019   CALCIUM 9.1 07/24/2019   Lab Results  Component Value Date   CHOL 159 07/24/2019   Lab Results  Component Value Date   HDL 55 07/24/2019   Lab Results  Component Value Date   LDLCALC 92 07/24/2019   Lab Results  Component Value Date   TRIG 58 07/24/2019   Lab Results  Component Value Date   CHOLHDL 2.9  07/24/2019   Lab Results  Component Value Date   HGBA1C 5.7 (H) 07/24/2019      Assessment & Plan:   Problem List Items Addressed This Visit    None    Visit Diagnoses    Special screening for malignant neoplasms, colon    -  Primary   Relevant Orders   Ambulatory referral to Gastroenterology   Screening for endocrine, metabolic and immunity disorder       Relevant Orders   TSH (Completed)   Comprehensive metabolic panel (Completed)   CBC with  Differential (Completed)   Hemoglobin A1c (Completed)   Lipid screening       Relevant Orders   Lipid panel (Completed)   Acute idiopathic gout of left foot       Relevant Medications   indomethacin (INDOCIN) 50 MG capsule   Other Relevant Orders   Uric Acid (Completed)      Meds ordered this encounter  Medications  . indomethacin (INDOCIN) 50 MG capsule    Sig: Take 1 capsule (50 mg total) by mouth 3 (three) times daily with meals.    Dispense:  15 capsule    Refill:  0    Order Specific Question:   Supervising Provider    Answer:   Doristine Bosworth [id number]    Follow-up: No follow-ups on file.   PLAN  Labs collected  Indomethacin for gout  Return in 6 mos for htn follow up  Continue home monitoring, return if bp elevated or sxs arise  Referral sent for colonoscopy  Patient encouraged to call clinic with any questions, comments, or concerns.  Janeece Agee, NP

## 2019-08-13 ENCOUNTER — Ambulatory Visit: Payer: Self-pay | Attending: Internal Medicine

## 2019-08-13 DIAGNOSIS — Z23 Encounter for immunization: Secondary | ICD-10-CM

## 2019-08-13 NOTE — Progress Notes (Signed)
   Covid-19 Vaccination Clinic  Name:  Cory Murphy    MRN: [id number] DOB: 1961/03/23  08/13/2019  [name] was observed post Covid-19 immunization for 15 minutes without incident. He was provided with Vaccine Information Sheet and instruction to access the V-Safe system.   [name] was instructed to call 911 with any severe reactions post vaccine: Marland Kitchen Difficulty breathing  . Swelling of face and throat  . A fast heartbeat  . A bad rash all over body  . Dizziness and weakness   Immunizations Administered    Name Date Dose VIS Date Route   Pfizer COVID-19 Vaccine 08/13/2019  2:22 PM 0.3 mL 06/13/2018 Intramuscular   Manufacturer: [employer], [employer]   Lot: [id number]   NDC: [address]-0

## 2020-02-13 ENCOUNTER — Ambulatory Visit: Payer: BC Managed Care – PPO | Admitting: Registered Nurse

## 2020-02-13 ENCOUNTER — Other Ambulatory Visit: Payer: Self-pay

## 2020-02-13 ENCOUNTER — Encounter: Payer: Self-pay | Admitting: Registered Nurse

## 2020-02-13 VITALS — BP 144/83 | HR 83 | Temp 98.1°F | Resp 18 | Ht 63.0 in | Wt 135.4 lb

## 2020-02-13 DIAGNOSIS — Z1211 Encounter for screening for malignant neoplasm of colon: Secondary | ICD-10-CM

## 2020-02-13 DIAGNOSIS — M542 Cervicalgia: Secondary | ICD-10-CM

## 2020-02-13 MED ORDER — PREDNISONE 10 MG (21) PO TBPK: ORAL_TABLET | ORAL | 0 refills | Status: AC

## 2020-02-13 MED ORDER — METHOCARBAMOL 500 MG PO TABS: ORAL_TABLET | ORAL | 1 refills | Status: AC

## 2020-02-13 NOTE — Patient Instructions (Signed)
° ° ° °  If you have lab work done today you will be contacted with your lab results within the next 2 weeks.  If you have not heard from us then please contact us. The fastest way to get your results is to register for My Chart. ° ° °IF you received an x-ray today, you will receive an invoice from [facility]. Please contact [facility] at [phone] with questions or concerns regarding your invoice.  ° °IF you received labwork today, you will receive an invoice from [facility]. Please contact [facility] at [phone] with questions or concerns regarding your invoice.  ° °Our billing staff will not be able to assist you with questions regarding bills from these companies. ° °You will be contacted with the lab results as soon as they are available. The fastest way to get your results is to activate your My Chart account. Instructions are located on the last page of this paperwork. If you have not heard from us regarding the results in 2 weeks, please contact this office. °  ° ° ° °

## 2020-02-13 NOTE — Progress Notes (Signed)
Acute Office Visit  Subjective:    Patient ID: Cory Murphy, male    DOB: December 04, 1960, 59 y.o.   MRN: [id number]  Chief Complaint  Patient presents with  . Neck Pain    patient states he has been having some neck pain for about 1 month that seems to be getting worse. Patient states he can not really turn his head he has to turn his whole body instead.    HPI Patient is in today for c spine pain Has happened in the past, thinks it's repetitive use from work No new or different symptoms Midline pain and tenderness in c spine and paraspinal from base of skull towards shoulders.  No involvement of shoulders. No numbness or tingling. No new headaches. No acute injury. No signs of stenosis or saddle anesthesia.   Notes that he is due for colonoscopy - referral will be placed.  Has questions about COVID booster - instructed to discuss with his pharmacist.  Past Medical History:  Diagnosis Date  . Back pain     No past surgical history on file.  No family history on file.  Social History   Socioeconomic History  . Marital status: Significant Other    Spouse name: Not on file  . Number of children: Not on file  . Years of education: Not on file  . Highest education level: Not on file  Occupational History  . Occupation: Welder  Tobacco Use  . Smoking status: Former Smoker    Quit date: 11/17/2016    Years since quitting: 3.2  . Smokeless tobacco: Never Used  Vaping Use  . Vaping Use: Never used  Substance and Sexual Activity  . Alcohol use: Yes    Comment: less frequently d/t gout  . Drug use: No  . Sexual activity: Yes  Other Topics Concern  . Not on file  Social History Narrative   Significant other. Education: [employer].    Social Determinants of Health   Financial Resource Strain:   . Difficulty of Paying Living Expenses: Not on file  Food Insecurity:   . Worried About Programme researcher, broadcasting/film/video in the Last Year: Not on file  . Ran Out of Food in the Last Year:  Not on file  Transportation Needs:   . Lack of Transportation (Medical): Not on file  . Lack of Transportation (Non-Medical): Not on file  Physical Activity:   . Days of Exercise per Week: Not on file  . Minutes of Exercise per Session: Not on file  Stress:   . Feeling of Stress : Not on file  Social Connections:   . Frequency of Communication with Friends and Family: Not on file  . Frequency of Social Gatherings with Friends and Family: Not on file  . Attends Religious Services: Not on file  . Active Member of Clubs or Organizations: Not on file  . Attends Banker Meetings: Not on file  . Marital Status: Not on file  Intimate Partner Violence:   . Fear of Current or Ex-Partner: Not on file  . Emotionally Abused: Not on file  . Physically Abused: Not on file  . Sexually Abused: Not on file    Outpatient Medications Prior to Visit  Medication Sig Dispense Refill  . busPIRone (BUSPAR) 7.5 MG tablet Take 1 tablet (7.5 mg total) by mouth 3 (three) times daily. 90 tablet 0  . indomethacin (INDOCIN) 50 MG capsule Take 1 capsule (50 mg total) by mouth 3 (three) times daily  with meals. 15 capsule 0  . methocarbamol (ROBAXIN) 500 MG tablet Take 1 in the morning, 1 in the afternoon, and 1-2 at bedtime as needed for muscle relaxant for neck (Patient not taking: Reported on 07/24/2019) 30 tablet 1   No facility-administered medications prior to visit.    No Known Allergies  Review of Systems  Constitutional: Negative.   HENT: Negative.   Eyes: Negative.   Respiratory: Negative.   Cardiovascular: Negative.   Gastrointestinal: Negative.   Genitourinary: Negative.   Musculoskeletal: Positive for neck pain and neck stiffness. Negative for arthralgias, back pain, gait problem, joint swelling and myalgias.  Skin: Negative.   Neurological: Negative.   Psychiatric/Behavioral: Negative.        Objective:    Physical Exam Vitals and nursing note reviewed.  Constitutional:        Appearance: Normal appearance.  Neck:     Comments: rom limited in all aspects in neck. Cardiovascular:     Rate and Rhythm: Normal rate and regular rhythm.  Musculoskeletal:     Cervical back: Rigidity and tenderness present.  Neurological:     General: No focal deficit present.     Mental Status: He is alert and oriented to person, place, and time. Mental status is at baseline.  Psychiatric:        Mood and Affect: Mood normal.        Behavior: Behavior normal.        Thought Content: Thought content normal.        Judgment: Judgment normal.     BP (!) 144/83   Pulse 83   Temp 98.1 F (36.7 C) (Temporal)   Resp 18   Ht 5\' 3"  (1.6 m)   Wt 135 lb 6.4 oz (61.4 kg)   SpO2 98%   BMI 23.99 kg/m  Wt Readings from Last 3 Encounters:  02/13/20 135 lb 6.4 oz (61.4 kg)  07/24/19 136 lb (61.7 kg)  01/23/19 132 lb (59.9 kg)    Health Maintenance Due  Topic Date Due  . COLONOSCOPY  Never done    There are no preventive care reminders to display for this patient.   Lab Results  Component Value Date   TSH 0.953 07/24/2019   Lab Results  Component Value Date   WBC 8.0 07/24/2019   HGB 14.3 07/24/2019   HCT 43.2 07/24/2019   MCV 90 07/24/2019   PLT 374 07/24/2019   Lab Results  Component Value Date   NA 142 07/24/2019   K 4.1 07/24/2019   CO2 20 07/24/2019   GLUCOSE 86 07/24/2019   BUN 18 07/24/2019   CREATININE 0.67 (L) 07/24/2019   BILITOT 0.4 07/24/2019   ALKPHOS 125 (H) 07/24/2019   AST 18 07/24/2019   ALT 7 07/24/2019   PROT 6.9 07/24/2019   ALBUMIN 4.2 07/24/2019   CALCIUM 9.1 07/24/2019   Lab Results  Component Value Date   CHOL 159 07/24/2019   Lab Results  Component Value Date   HDL 55 07/24/2019   Lab Results  Component Value Date   LDLCALC 92 07/24/2019   Lab Results  Component Value Date   TRIG 58 07/24/2019   Lab Results  Component Value Date   CHOLHDL 2.9 07/24/2019   Lab Results  Component Value Date   HGBA1C 5.7 (H)  07/24/2019       Assessment & Plan:   Problem List Items Addressed This Visit    None    Visit Diagnoses  Screening for colon cancer    -  Primary   Relevant Orders   Ambulatory referral to Gastroenterology   Cervical pain (neck)       Relevant Medications   predniSONE (STERAPRED UNI-PAK 21 TAB) 10 MG (21) TBPK tablet   methocarbamol (ROBAXIN) 500 MG tablet       Meds ordered this encounter  Medications  . predniSONE (STERAPRED UNI-PAK 21 TAB) 10 MG (21) TBPK tablet    Sig: Take per package instructions. Do not skip doses. Finish entire supply.    Dispense:  1 each    Refill:  0    Order Specific Question:   Supervising Provider    Answer:   Neva Seat, JEFFREY R [[id number]]  . methocarbamol (ROBAXIN) 500 MG tablet    Sig: Take 1 in the morning, 1 in the afternoon, and 1-2 at bedtime as needed for muscle relaxant for neck    Dispense:  30 tablet    Refill:  1    Order Specific Question:   Supervising Provider    Answer:   Neva Seat, JEFFREY R [[id number]]   PLAN  sterapred for neck pain. Can use meloxicam and robaxin as well. Return if worsening or failing to improve  Pt declines PT at this time. Pt declines imaging at this time. Imaging in Sept 2020 shows degenerative changes but not significant enough to cause current pain. Would suggest additional imaging but I think it's safe to say this is more muscular in nature. Imaging will be next step with any new neuro symptoms.   Patient encouraged to call clinic with any questions, comments, or concerns.   Janeece Agee, NP

## 2020-10-16 DIAGNOSIS — Z1211 Encounter for screening for malignant neoplasm of colon: Secondary | ICD-10-CM | POA: Diagnosis not present

## 2020-10-16 DIAGNOSIS — M109 Gout, unspecified: Secondary | ICD-10-CM | POA: Diagnosis not present

## 2020-10-16 DIAGNOSIS — M545 Low back pain, unspecified: Secondary | ICD-10-CM | POA: Diagnosis not present

## 2020-10-16 DIAGNOSIS — R7309 Other abnormal glucose: Secondary | ICD-10-CM | POA: Diagnosis not present

## 2020-10-16 DIAGNOSIS — Z720 Tobacco use: Secondary | ICD-10-CM | POA: Diagnosis not present

## 2020-10-16 DIAGNOSIS — G8929 Other chronic pain: Secondary | ICD-10-CM | POA: Diagnosis not present

## 2020-12-04 DIAGNOSIS — Z720 Tobacco use: Secondary | ICD-10-CM | POA: Diagnosis not present

## 2020-12-04 DIAGNOSIS — M545 Low back pain, unspecified: Secondary | ICD-10-CM | POA: Diagnosis not present

## 2020-12-04 DIAGNOSIS — R7303 Prediabetes: Secondary | ICD-10-CM | POA: Diagnosis not present

## 2020-12-04 DIAGNOSIS — Z0000 Encounter for general adult medical examination without abnormal findings: Secondary | ICD-10-CM | POA: Diagnosis not present

## 2020-12-04 DIAGNOSIS — M109 Gout, unspecified: Secondary | ICD-10-CM | POA: Diagnosis not present
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
## 2021-01-27 DIAGNOSIS — Z1211 Encounter for screening for malignant neoplasm of colon: Secondary | ICD-10-CM | POA: Diagnosis not present

## 2021-01-27 DIAGNOSIS — K5730 Diverticulosis of large intestine without perforation or abscess without bleeding: Secondary | ICD-10-CM | POA: Diagnosis not present

## 2021-01-30 DIAGNOSIS — R7303 Prediabetes: Secondary | ICD-10-CM | POA: Diagnosis not present

## 2021-01-30 DIAGNOSIS — Z720 Tobacco use: Secondary | ICD-10-CM | POA: Diagnosis not present

## 2021-01-30 DIAGNOSIS — M1A0410 Idiopathic chronic gout, right hand, without tophus (tophi): Secondary | ICD-10-CM | POA: Diagnosis not present

## 2021-02-05 DIAGNOSIS — M1A0410 Idiopathic chronic gout, right hand, without tophus (tophi): Secondary | ICD-10-CM | POA: Diagnosis not present

## 2021-03-10 DIAGNOSIS — M5416 Radiculopathy, lumbar region: Secondary | ICD-10-CM | POA: Diagnosis not present
